# Patient Record
Sex: Female | Born: 1955
Health system: Southern US, Community
[De-identification: ages and names within clinical notes are randomized; demographics above are authoritative.]

## PROBLEM LIST (undated history)

## (undated) DIAGNOSIS — E079 Disorder of thyroid, unspecified: Secondary | ICD-10-CM

## (undated) DIAGNOSIS — K219 Gastro-esophageal reflux disease without esophagitis: Secondary | ICD-10-CM

## (undated) DIAGNOSIS — M1990 Unspecified osteoarthritis, unspecified site: Secondary | ICD-10-CM

## (undated) DIAGNOSIS — Z8709 Personal history of other diseases of the respiratory system: Secondary | ICD-10-CM

## (undated) DIAGNOSIS — R011 Cardiac murmur, unspecified: Secondary | ICD-10-CM

## (undated) DIAGNOSIS — J189 Pneumonia, unspecified organism: Secondary | ICD-10-CM

## (undated) HISTORY — PX: ABDOMINAL HYSTERECTOMY: SHX81

## (undated) HISTORY — PX: BUNIONECTOMY: SHX129

## (undated) HISTORY — PX: HIP SURGERY: SHX245

## (undated) HISTORY — PX: OTHER SURGICAL HISTORY: SHX169

## (undated) HISTORY — DX: Unspecified osteoarthritis, unspecified site: M19.90

## (undated) HISTORY — PX: APPENDECTOMY: SHX54

## (undated) HISTORY — PX: ROTATOR CUFF REPAIR: SHX139

## (undated) HISTORY — DX: Disorder of thyroid, unspecified: E07.9

---

## 2006-11-18 ENCOUNTER — Emergency Department (HOSPITAL_COMMUNITY): Admission: EM | Admit: 2006-11-18 | Discharge: 2006-11-18 | Payer: Self-pay | Admitting: Family Medicine

## 2006-11-21 ENCOUNTER — Emergency Department (HOSPITAL_COMMUNITY): Admission: EM | Admit: 2006-11-21 | Discharge: 2006-11-21 | Payer: Self-pay | Admitting: Family Medicine

## 2007-04-02 ENCOUNTER — Emergency Department (HOSPITAL_COMMUNITY): Admission: EM | Admit: 2007-04-02 | Discharge: 2007-04-02 | Payer: Self-pay | Admitting: Emergency Medicine

## 2007-04-07 ENCOUNTER — Ambulatory Visit (HOSPITAL_COMMUNITY): Admission: RE | Admit: 2007-04-07 | Discharge: 2007-04-07 | Payer: Self-pay | Admitting: Family Medicine

## 2007-04-20 ENCOUNTER — Encounter: Admission: RE | Admit: 2007-04-20 | Discharge: 2007-05-23 | Payer: Self-pay | Admitting: Family Medicine

## 2008-02-09 ENCOUNTER — Ambulatory Visit (HOSPITAL_BASED_OUTPATIENT_CLINIC_OR_DEPARTMENT_OTHER): Admission: RE | Admit: 2008-02-09 | Discharge: 2008-02-09 | Payer: Self-pay | Admitting: Orthopedic Surgery

## 2008-05-10 ENCOUNTER — Ambulatory Visit (HOSPITAL_BASED_OUTPATIENT_CLINIC_OR_DEPARTMENT_OTHER): Admission: RE | Admit: 2008-05-10 | Discharge: 2008-05-11 | Payer: Self-pay | Admitting: Orthopedic Surgery

## 2008-11-23 DIAGNOSIS — J189 Pneumonia, unspecified organism: Secondary | ICD-10-CM

## 2008-11-23 HISTORY — DX: Pneumonia, unspecified organism: J18.9

## 2009-02-14 ENCOUNTER — Ambulatory Visit (HOSPITAL_COMMUNITY): Admission: RE | Admit: 2009-02-14 | Discharge: 2009-02-14 | Payer: Self-pay | Admitting: Family Medicine

## 2009-08-22 ENCOUNTER — Encounter: Admission: RE | Admit: 2009-08-22 | Discharge: 2009-08-22 | Payer: Self-pay | Admitting: Orthopedic Surgery

## 2010-05-04 ENCOUNTER — Emergency Department (HOSPITAL_COMMUNITY): Admission: EM | Admit: 2010-05-04 | Discharge: 2010-05-04 | Payer: Self-pay | Admitting: Emergency Medicine

## 2010-06-22 ENCOUNTER — Emergency Department (HOSPITAL_COMMUNITY): Admission: EM | Admit: 2010-06-22 | Discharge: 2010-06-22 | Payer: Self-pay | Admitting: Family Medicine

## 2010-06-22 ENCOUNTER — Encounter: Payer: Self-pay | Admitting: Internal Medicine

## 2010-06-29 ENCOUNTER — Emergency Department (HOSPITAL_COMMUNITY): Admission: EM | Admit: 2010-06-29 | Discharge: 2010-06-29 | Payer: Self-pay | Admitting: Family Medicine

## 2010-07-18 ENCOUNTER — Ambulatory Visit: Payer: Self-pay | Admitting: Internal Medicine

## 2010-07-18 DIAGNOSIS — R059 Cough, unspecified: Secondary | ICD-10-CM | POA: Insufficient documentation

## 2010-07-18 DIAGNOSIS — R05 Cough: Secondary | ICD-10-CM

## 2010-07-31 ENCOUNTER — Ambulatory Visit: Payer: Self-pay | Admitting: Internal Medicine

## 2010-08-06 ENCOUNTER — Ambulatory Visit: Payer: Self-pay | Admitting: Internal Medicine

## 2010-08-18 ENCOUNTER — Telehealth: Payer: Self-pay | Admitting: Internal Medicine

## 2010-12-23 NOTE — Progress Notes (Signed)
Summary: nos appt  Phone Note Call from Patient   Caller: [email or web address]  Call For: wert Summary of Call: In ref to nos from 9/23, pt states she doesn't need a rov, she feels much better, states she will continue to take meds prescribed. Initial call taken by: Darletta Moll,  August 18, 2010 2:46 PM

## 2010-12-23 NOTE — Assessment & Plan Note (Signed)
Summary: Pulmonary/ ext ov for refractory cough > sinus ct ordered   Copy to:  Dr. Clyda Greener Primary Provider/Referring Provider:  Dr. Clyda Greener  CC:  2 wk followup.  Pt states that after last seen her cough had started to improve but then returned a few days ago when developed a "cold".  Cough is prod with clear sputum.  She states that she had to d/c tramadol due to itching and vomiting.Marland Kitchen  History of Present Illness: 8 yobf quit smokng 1993 no resp symptoms at that point  July 18, 2010  1st pulmonary office eval abupt onset June 09 2010  while in bilbe study  started as a  tickle in throat dx with bronchitis then asthma.  no better with prednisone codiene or codeine assoc with sore throat and dysphagia food  sticks in throat.  cough worse on insp, dry makes her gag and loose voice but  not vomit. day > night but does disturb sleep.  Prilosec Take one 30-60 min before first and last meals of the day  Pepcid 20 mg at bedtime Take delsym two tsp every 12 hours and add tramadol 50 mg up to every 4 hours to suppress the urge to cough.  > could not tolerate tramadol  July 31, 2010 2 wk followup.  Pt states that after last seen her cough had started to improve but then returned a few days ago when developed a "cold".  Cough is prod with clear sputum.  She states that she had to d/c tramadol due to itching and vomiting. worse day than night. mild assoc nasal congestion.  Pt denies any significant sore throat, dysphagia, itching, sneezing,   purulent secretions,  fever, chills, sweats, unintended wt loss, pleuritic or exertional cp, hempoptysis, change in activity tolerance  orthopnea pnd or leg swelling Pt also denies any obvious fluctuation in symptoms with weather or environmental change or other alleviating or aggravating factors.       Current Medications (verified): 1)  Prilosec Otc 20 Mg Tbec (Omeprazole Magnesium) .... Take One 30-60 Min Before First and Last Meals of The Day 2)   Pepcid Ac Maximum Strength 20 Mg Tabs (Famotidine) .... One At Bedtime  Allergies (verified): 1)  ! * Teracycline  Past History:  Past Medical History: Chronic cough............................Marland KitchenWert       - no response to prednisone        - Sinus CT July 31, 2010 >>>  Vital Signs:  Patient profile:   55 year old female Weight:      330 pounds O2 Sat:      96 % on Room air Temp:     98.8 degrees F oral Pulse rate:   89 / minute BP sitting:   112 / 74  (left arm) Cuff size:   large  Vitals Entered By: Vernie Murders (July 31, 2010 11:46 AM)  O2 Flow:  Room air  Physical Exam  Additional Exam:  pleasant but very hoarse amb bf nad   wt 326 July 19, 2010 > 330 July 31, 2010  HEENT: nl dentition, turbinates, and orophanx. Nl external ear canals without cough reflex NECK :  without JVD/Nodes/TM/ nl carotid upstrokes bilaterally LUNGS: no acc muscle use, clear to A and P bilaterally without cough on insp or exp maneuvers CV:  RRR  no s3 or murmur or increase in P2, no edema  ABD:  soft and nontender with nl excursion in the supine position. No bruits or  organomegaly, bowel sounds nl MS:  warm without deformities, calf tenderness, cyanosis or clubbing       Impression & Recommendations:  Problem # 1:  COUGH (ICD-786.2) Of the three most common causes of chronic cough, only one (GERD)  can actually cause the other two and perpetuate the cylce of cough inducing airway trauma, inflammation, heightened sensitivity to reflux which is prompted by the cough itself via a cyclical mechanism.  This may partially respond to steroids and look like asthma and post nasal drainage but never erradicated completely unless the cough and the secondary reflux are eliminated, preferably both at the same time.   The standardized cough guidelines recently published in Chest are a 14 step process, not a single office visit,  and are intended  to address this problem logically,  with an  alogrithm dependent on response to each progressive step  to determine a specific diagnosis with  minimal addtional testing needed. Therefore if compliance is an issue this empiric standardized approach simply won't work.   Start over with suppression with vicodin and sinus ct , next steps allergy eval and reglan challenge if not better  See instructions for specific recommendations   Medications Added to Medication List This Visit: 1)  Prilosec Otc 20 Mg Tbec (Omeprazole magnesium) .... Take one 30-60 min before first and last meals of the day 2)  Prednisone 10 Mg Tabs (Prednisone) .... 4 each am x 2days, 2x2days, 1x2days and stop 3)  Chlor-trimeton 4 Mg Tabs (Chlorpheniramine maleate) .... One every 6 hours for urge to clear the throat as needed 4)  Vicodin Hp 10-660 Mg Tabs (Hydrocodone-acetaminophen) .... One every 4 hours every if needed to suppress the urge to clear throat or cough  Other Orders: Misc. Referral (Misc. Ref) Est. Patient Level IV (47829)  Patient Instructions: 1)  continue prilosec and pepcid as before, perfectly regularly 2)  Take delsym two tsp every 12 hours and add  vicodin  up to every 4 hours to suppress the urge to cough. Swallowing water or using ice chips/non mint and menthol containing candies (such as lifesavers or sugarless jolly ranchers) are also effective.  3)  Chlortrimeton for the tickle every 6 hours 4)  See Patient Care Coordinator before leaving for sinus ct  5)  Please schedule a follow-up appointment in 2 weeks if not 100% better  Prescriptions: VICODIN HP 10-660 MG TABS (HYDROCODONE-ACETAMINOPHEN) one every 4 hours every if needed to suppress the urge to clear throat or cough  #40 x 0   Entered and Authorized by:   Nyoka Cowden MD   Signed by:   Nyoka Cowden MD on 07/31/2010   Method used:   Printed then faxed to ...       [facility] [address] [phone]* (retail)       [address]       [place], [place]  [address]       Ph: [phone]        Fax: [phone]   RxID:   [id number] PREDNISONE 10 MG  TABS (PREDNISONE) 4 each am x 2days, 2x2days, 1x2days and stop  #14 x 0   Entered and Authorized by:   Nyoka Cowden MD   Signed by:   Nyoka Cowden MD on 07/31/2010   Method used:   Electronically to        [facility] [phone]* (retail)       [address]  [place], [place]  [address]       Ph: [phone]       Fax: [phone]   RxID:   [id number]

## 2010-12-23 NOTE — Assessment & Plan Note (Signed)
Summary: Pulmonary new pt eval cough c/w UACS   Visit Type:  Initial Consult Copy to:  Dr. Clyda Greener Primary Provider/Referring Provider:  Dr. Clyda Greener  CC:  Cough.  History of Present Illness: 55 yobf quit smokng 1993 no resp symptoms at that point  July 18, 2010  1st pulmonary office eval abupt onset June 09 2010  while in bilbe study  started as a  tickle in throat dx with bronchitis then asthma.  no better with prednisone codiene or codeine assoc with sore throat and dysphagia food  sticks in throat.  cough worse on insp, dry makes her gag and loose voice but  not vomit. day > night but does disturb sleep. Pt denies any significant itching, sneezing,  nasal congestion or excess secretions,  fever, chills, sweats, unintended wt loss, pleuritic or exertional cp, hempoptysis, change in activity tolerance  orthopnea pnd or leg swelling  Pt also denies any obvious fluctuation in symptoms with weather or environmental change or other alleviating or aggravating factors.     no better with robitussin  Current Medications (verified): 1)  Ventolin Hfa 108 (90 Base) Mcg/act Aers (Albuterol Sulfate) .... 2 Puffs Every 4-6 Hours As Needed  Allergies (verified): 1)  ! * Teracycline  Past History:  Past Medical History: Chronic cough............................Marland KitchenWert       - no response to prednisone   Past Surgical History: Hysterectomy 1986 ACL surgery 2009 Hip surgery 2009  Family History: Heart dz- Mother Pancreatic CA- MGM Negative for respiratory diseases or atopy   Social History: Married with children Mother lives with her Former smoker.  Quit in 1993.  Smoked approx 5 yrs up to 1/4 ppd. Unemployed  Review of Systems       The patient complains of shortness of breath at rest, non-productive cough, chest pain, difficulty swallowing, sore throat, and ear ache.  The patient denies shortness of breath with activity, productive cough, coughing up blood, irregular  heartbeats, acid heartburn, indigestion, loss of appetite, weight change, abdominal pain, tooth/dental problems, headaches, nasal congestion/difficulty breathing through nose, sneezing, itching, anxiety, depression, hand/feet swelling, joint stiffness or pain, rash, change in color of mucus, and fever.    Vital Signs:  Patient profile:   55 year old female Height:      69 inches Weight:      326 pounds BMI:     48.32 O2 Sat:      96 % on Room air Temp:     98.5 degrees F oral Pulse rate:   91 / minute BP sitting:   120 / 70  (left arm) Cuff size:   large  Vitals Entered By: Vernie Murders (July 18, 2010 11:44 AM)  O2 Flow:  Room air  Physical Exam  Additional Exam:  pleasant but very hoarse amb bf nad with classic pseudowheeze resolves with purse lip maneuver  wt 326 July 19, 2010 HEENT: nl dentition, turbinates, and orophanx. Nl external ear canals without cough reflex NECK :  without JVD/Nodes/TM/ nl carotid upstrokes bilaterally LUNGS: no acc muscle use, clear to A and P bilaterally without cough on insp or exp maneuvers CV:  RRR  no s3 or murmur or increase in P2, no edema  ABD:  soft and nontender with nl excursion in the supine position. No bruits or organomegaly, bowel sounds nl MS:  warm without deformities, calf tenderness, cyanosis or clubbing SKIN: warm and dry without lesions   NEURO:  alert, approp, no deficits     CXR  Procedure date:  06/22/2010  Findings:      no acute cardiopulmonary disease  Impression & Recommendations:  Problem # 1:  COUGH (ICD-786.2) Assessment Unchanged  The most common causes of chronic cough in immunocompetent adults include: upper airway cough syndrome (UACS), previously referred to as postnasal drip syndrome,  caused by variety of rhinosinus conditions; (2) asthma; (3) GERD; (4) chronic bronchitis from cigarette smoking or other inhaled environmental irritants; (5) nonasthmatic eosinophilic bronchitis; and (6) bronchiectasis.  These conditions, singly or in combination, have accounted for up to 94% of the causes of chronic cough in prospective studies.  No response to prednisone or saba strongly argues against asthma or eosinophilic bronchitis or rhinitis  this is most c/w  Classic Upper airway cough syndrome, so named because it's frequently impossible to sort out how much is  CR/sinusitis with freq throat clearing (which can be related to primary GERD)   vs  causing  secondary extra esophageal GERD from wide swings in gastric pressure that occur with throat clearing, promoting self use of mint and menthol lozenges that reduce the lower esophageal sphincter tone and exacerbate the problem further These are the same pts who not infrequently have failed to tolerate ace inhibitors,  dry powder inhalers or biphosphonates or report having reflux symptoms that don't respond to standard doses of PPI  For now max rx gerd and suppress cyclical coughing with tramadol  Orders: New Patient Level V (81191)  Medications Added to Medication List This Visit: 1)  Ventolin Hfa 108 (90 Base) Mcg/act Aers (Albuterol sulfate) .... 2 puffs every 4-6 hours as needed 2)  Prilosec Otc 20 Mg Tbec (Omeprazole magnesium) .... Take one 30-60 min before first and last meals of the day 3)  Pepcid Ac Maximum Strength 20 Mg Tabs (Famotidine) .... One at bedtime 4)  Tramadol Hcl 50 Mg Tabs (Tramadol hcl) .... One to two by mouth every 4-6 hours if needed  Patient Instructions: 1)  Prilosec Take one 30-60 min before first and last meals of the day  2)  Pepcid 20 mg at bedtime 3)  Take delsym two tsp every 12 hours and add tramadol 50 mg up to every 4 hours to suppress the urge to cough. Swallowing water or using ice chips/non mint and menthol containing candies (such as lifesavers or sugarless jolly ranchers) are also effective.  4)  GERD (REFLUX)  is a common cause of respiratory symptoms. It commonly presents without heartburn and can be treated  with medication, but also with lifestyle changes including avoidance of late meals, excessive alcohol, smoking cessation, and avoid fatty foods, chocolate, peppermint, colas, red wine, and acidic juices such as orange juice. NO MINT OR MENTHOL PRODUCTS SO NO COUGH DROPS  5)  USE SUGARLESS CANDY INSTEAD (jolley ranchers)  6)  NO OIL BASED VITAMINS  7)  Please schedule a follow-up appointment in 6 weeks, sooner if needed  Prescriptions: TRAMADOL HCL 50 MG  TABS (TRAMADOL HCL) One to two by mouth every 4-6 hours if needed  #40 x 0   Entered and Authorized by:   Nyoka Cowden MD   Signed by:   Nyoka Cowden MD on 07/18/2010   Method used:   Electronically to        [facility] [phone]* (retail)       [address]       [place], [place]  [address]       Ph: [phone]       Fax:  [phone]   RxID:   [id number]

## 2011-04-01 ENCOUNTER — Emergency Department (HOSPITAL_COMMUNITY)
Admission: EM | Admit: 2011-04-01 | Discharge: 2011-04-01 | Disposition: A | Discharge status: Home / Self Care | Payer: Self-pay | Attending: Emergency Medicine | Admitting: Emergency Medicine

## 2011-04-01 ENCOUNTER — Emergency Department (HOSPITAL_COMMUNITY): Payer: Self-pay

## 2011-04-01 DIAGNOSIS — R51 Headache: Secondary | ICD-10-CM | POA: Insufficient documentation

## 2011-04-01 DIAGNOSIS — IMO0002 Reserved for concepts with insufficient information to code with codable children: Secondary | ICD-10-CM | POA: Insufficient documentation

## 2011-04-01 DIAGNOSIS — S0990XA Unspecified injury of head, initial encounter: Secondary | ICD-10-CM | POA: Insufficient documentation
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
## 2011-04-08 ENCOUNTER — Ambulatory Visit
Admission: RE | Admit: 2011-04-08 | Discharge: 2011-04-08 | Disposition: A | Discharge status: Home / Self Care | Payer: No Typology Code available for payment source | Source: Ambulatory Visit | Attending: Family Medicine | Admitting: Family Medicine

## 2011-04-08 ENCOUNTER — Other Ambulatory Visit: Payer: Self-pay | Admitting: Family Medicine

## 2011-04-08 DIAGNOSIS — J209 Acute bronchitis, unspecified: Secondary | ICD-10-CM

## 2011-08-20 LAB — POCT HEMOGLOBIN-HEMACUE
Hemoglobin: 13.1
Operator id: 114531

## 2012-08-14 IMAGING — CT CT CERVICAL SPINE W/O CM
2 of 3 series · 8 of 14 positions shown, 9 images · non-contrast
Comparison: CT 04/02/2007

CT HEAD

CLINICAL DATA: Head injury.  Headache and neck pain

CT HEAD WITHOUT CONTRAST
CT CERVICAL SPINE WITHOUT CONTRAST
TECHNIQUE: Multidetector CT imaging of the head and cervical spine
was performed following the standard protocol without intravenous
contrast.  Multiplanar CT image reconstructions of the cervical
spine were also generated.

[Series 6: c_spine 2.0 b31s detail · axial · 0.30mm/px · z∈[-278,-154]mm · 4 of 104 slices shown, 5 images]
[im 21/104  soft-tissue]
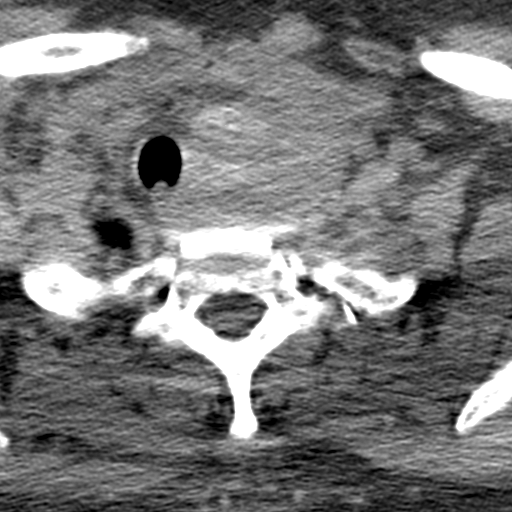
[im 21/104  bone]
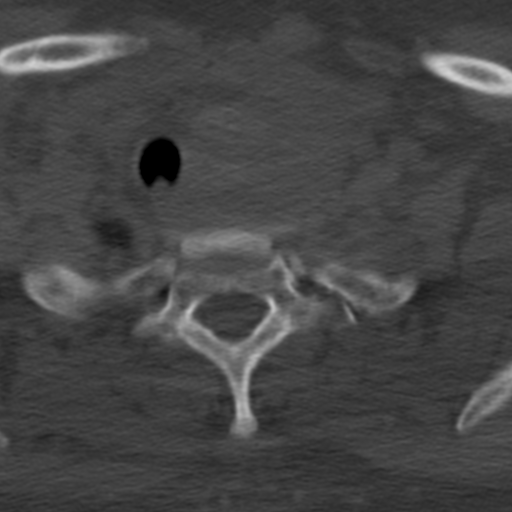
[im 42/104  bone]
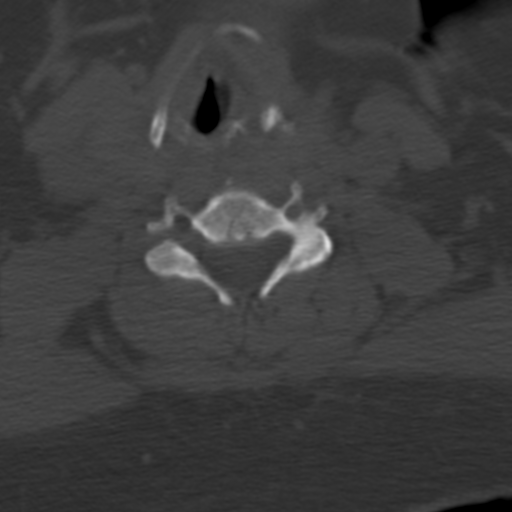
[im 62/104  bone]
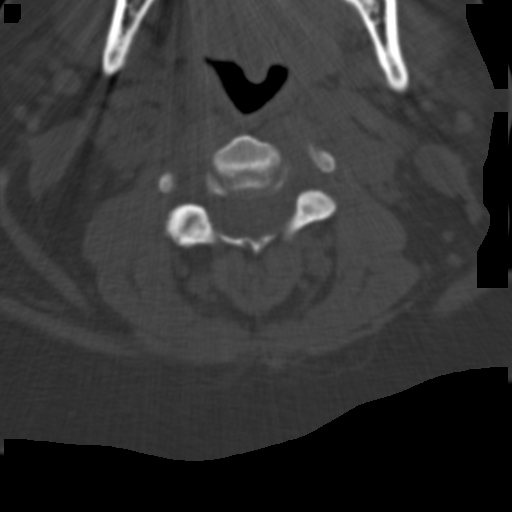
[im 83/104  bone]
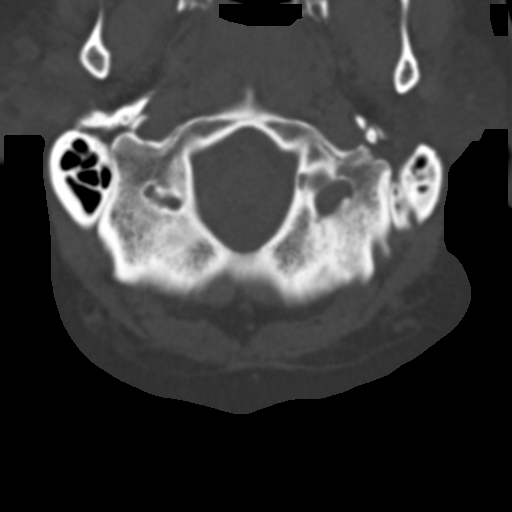

[Series 10: c_spine 2.0 spo thins · axial · 0.26mm/px · z∈[-291,-168]mm · 4 of 105 slices shown]
[im 21/105  bone]
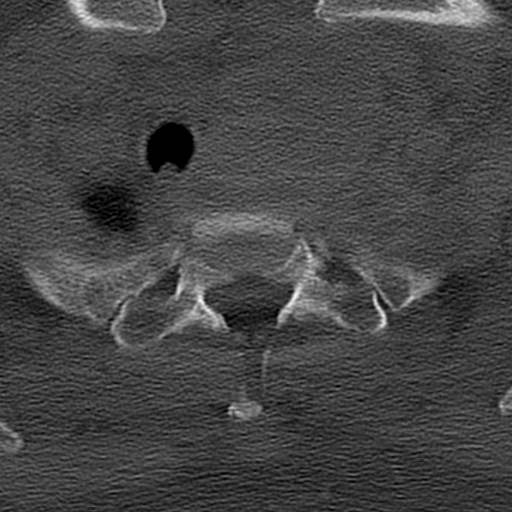
[im 42/105  bone]
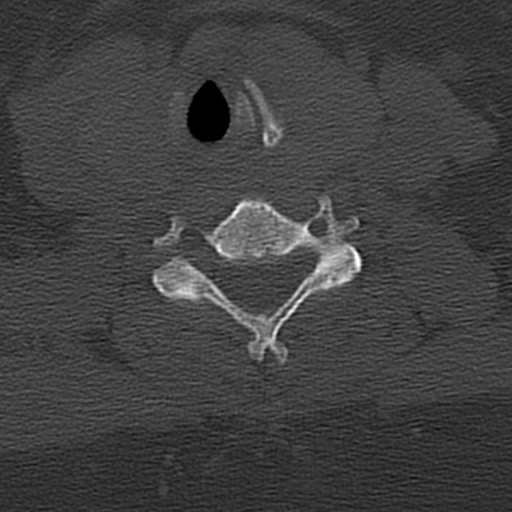
[im 63/105  bone]
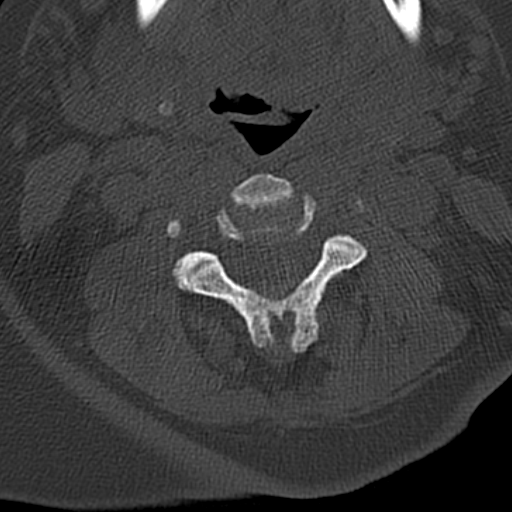
[im 84/105  bone]
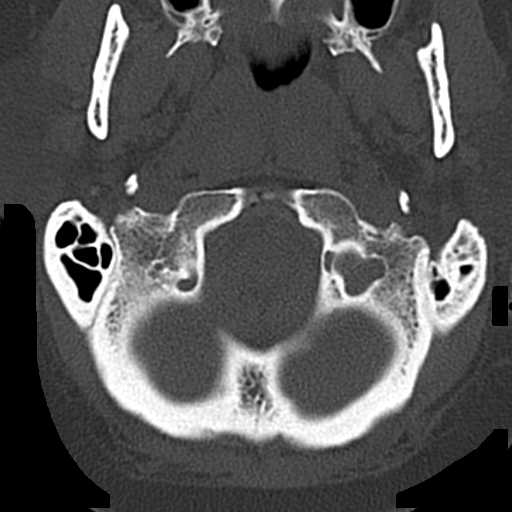

[8 of 14 positions shown; findings below may reference images not displayed]

FINDINGS: Ventricle size is normal.  Negative for hemorrhage.
Negative for infarct or mass.  The brain has a normal appearance.

Mild chronic sinusitis.  Negative for skull fracture.
IMPRESSION: No significant intracranial abnormality.

Mild chronic sinusitis.

CT CERVICAL SPINE
FINDINGS: Normal cervical alignment.  Negative for fracture.  Mild
facet degeneration on the right C2-3 and C3-4.  Mild degeneration
and C1-C2.  No acute bony abnormality.

Soft tissue mass in the upper mediastinum on the left deviates the
trachea to the right.  The mass is compatible with a substernal
goiter and measures 4.3 x 5.0 cm.
IMPRESSION: Negative for fracture.

Substernal goiter on the left.

## 2013-02-24 ENCOUNTER — Encounter (INDEPENDENT_AMBULATORY_CARE_PROVIDER_SITE_OTHER): Payer: Self-pay | Admitting: Surgery

## 2013-02-24 ENCOUNTER — Encounter (INDEPENDENT_AMBULATORY_CARE_PROVIDER_SITE_OTHER): Payer: Self-pay

## 2013-02-24 ENCOUNTER — Ambulatory Visit (INDEPENDENT_AMBULATORY_CARE_PROVIDER_SITE_OTHER): Payer: Self-pay | Admitting: Surgery

## 2013-02-24 VITALS — BP 136/80 | HR 76 | Temp 97.4°F | Resp 16 | Ht 70.0 in | Wt 330.0 lb

## 2013-02-24 DIAGNOSIS — E049 Nontoxic goiter, unspecified: Secondary | ICD-10-CM | POA: Insufficient documentation

## 2013-02-24 NOTE — Patient Instructions (Signed)

## 2013-02-24 NOTE — Progress Notes (Signed)
General Surgery [facility]  Chief Complaint  Patient presents with  . New Evaluation    eval thyroid mass with tracheal deviation - referral from Dr. Bruna Potter    HISTORY: Patient is a 57 year old black female referred by her primary care physician for evaluation of newly diagnosed thyroid nodule. Patient apparently underwent a CT scan in [place] while traveling with her work. This was a CT scan of the chest. It revealed an incidental finding of a left thyroid mass measuring greater than 5 cm in size and causing tracheal deviation to the right. No further workup or evaluation has been performed. Patient is referred to our office for further evaluation and recommendations.  Patient has no prior history of thyroid disease. She has never been on thyroid medication. She has had no prior head or neck surgery. There is no family history of thyroid disease and specifically no history of thyroid cancer. There is no family history of other endocrinopathy. There is no family history of endocrine tumors.  Patient is essentially asymptomatic. She has noted occasional episodes of dysphagia. She denies any pain.  History reviewed. No pertinent past medical history.   No current outpatient prescriptions on file.   No current facility-administered medications for this visit.     Allergies  Allergen Reactions  . Tetracyclines & Related Hives     Family History  Problem Relation Age of Onset  . Diabetes Mother   . Hypertension Mother   . Stroke Father   . Cancer Maternal Grandmother     pancreatic     History   Social History  . Marital Status: Married    Spouse Name: N/A    Number of Children: N/A  . Years of Education: N/A   Social History Main Topics  . Smoking status: Former Smoker    Types: Cigarettes    Quit date: 11/24/1983  . Smokeless tobacco: Never Used  . Alcohol Use: Yes     Comment: glass of wine on new year's eve  . Drug Use: No  . Sexually  Active: None   Other Topics Concern  . None   Social History Narrative  . None     REVIEW OF SYSTEMS - PERTINENT POSITIVES ONLY: Denies tremor. Denies palpitations. Denies compressive symptoms. Denies palpable mass.  EXAM: Filed Vitals:   02/24/13 1329  BP: 136/80  Pulse: 76  Temp: 97.4 F (36.3 C)  Resp: 16    HEENT: normocephalic; pupils equal and reactive; sclerae clear; dentition good; mucous membranes moist NECK:  Soft but enlarged thyroid gland, greater on the left than on the right, with the left lobe extending beneath the head of the left clavicle; symmetric on extension; no palpable anterior or posterior cervical lymphadenopathy; no supraclavicular masses; no tenderness CHEST: clear to auscultation bilaterally without rales, rhonchi, or wheezes CARDIAC: regular rate and rhythm without significant murmur; peripheral pulses are full EXT:  non-tender without edema; no deformity; walking brace on her right ankle NEURO: no gross focal deficits; no sign of tremor   LABORATORY RESULTS: See [facility] (CHL-Epic) for most recent results   RADIOLOGY RESULTS: See [facility] (CHL-Epic) for most recent results   IMPRESSION: Left thyroid mass, substernal  PLAN: I discussed the above findings at length with the patient. We reviewed the report of the CT scan of the chest from [place] together. We are going to initiate a workup of her thyroid mass. This will include a thyroid ultrasound and a TSH level. If there is  a significant mass in the left thyroid lobe, we will request an ultrasound-guided fine-needle aspiration biopsy.  Once these studies are performed, I will contact the patient with the results and we will make a decision on how to proceed at that time. Patient and I did discuss the possibility of surgery.  We also discussed the option of observation with sequential ultrasound scanning and physical examination. We will await the results of the above  studies and then make a decision.  Velora Heckler, MD, FACS General & Endocrine Surgery [facility]   Visit Diagnoses: 1. Substernal thyroid goiter     Primary Care Physician: Burtis Junes, MD

## 2013-02-28 ENCOUNTER — Other Ambulatory Visit (HOSPITAL_COMMUNITY)
Admission: RE | Admit: 2013-02-28 | Discharge: 2013-02-28 | Disposition: A | Discharge status: Home / Self Care | Payer: Self-pay | Source: Ambulatory Visit | Attending: Interventional Radiology | Admitting: Interventional Radiology

## 2013-02-28 ENCOUNTER — Ambulatory Visit
Admission: RE | Admit: 2013-02-28 | Discharge: 2013-02-28 | Disposition: A | Discharge status: Home / Self Care | Payer: No Typology Code available for payment source | Source: Ambulatory Visit | Attending: Surgery | Admitting: Surgery

## 2013-02-28 DIAGNOSIS — E049 Nontoxic goiter, unspecified: Secondary | ICD-10-CM

## 2013-02-28 DIAGNOSIS — E041 Nontoxic single thyroid nodule: Secondary | ICD-10-CM | POA: Insufficient documentation

## 2013-03-03 ENCOUNTER — Telehealth (INDEPENDENT_AMBULATORY_CARE_PROVIDER_SITE_OTHER): Payer: Self-pay

## 2013-03-03 NOTE — Telephone Encounter (Signed)
Request to review path and u/s sent to Dr Gerrit Friends for review. Pt advised of path result and will call her with recommendations once Dr Gerrit Friends has reviewed results.

## 2013-03-09 ENCOUNTER — Telehealth (INDEPENDENT_AMBULATORY_CARE_PROVIDER_SITE_OTHER): Payer: Self-pay

## 2013-03-09 ENCOUNTER — Other Ambulatory Visit (INDEPENDENT_AMBULATORY_CARE_PROVIDER_SITE_OTHER): Payer: Self-pay

## 2013-03-09 DIAGNOSIS — E042 Nontoxic multinodular goiter: Secondary | ICD-10-CM

## 2013-03-09 NOTE — Telephone Encounter (Signed)
Message copied by Joanette Gula on Thu Mar 09, 2013  3:37 PM ------      Message from: Velora Heckler      Created: Thu Mar 09, 2013  3:31 PM       Notify patient that biopsy is benign.  Will see in 6 months with follow up ultrasound and TSH level please.            Thanks,            tmg             ------

## 2013-03-09 NOTE — Telephone Encounter (Signed)
Per Dr Gerrit Friends pt advised of path result and f/u 6 mo U/S with tsh will be needed. U/s date will be set up and mailed to pt with lab slip for Oct 2014.

## 2013-03-20 ENCOUNTER — Encounter (INDEPENDENT_AMBULATORY_CARE_PROVIDER_SITE_OTHER): Payer: Self-pay

## 2013-03-22 ENCOUNTER — Encounter (INDEPENDENT_AMBULATORY_CARE_PROVIDER_SITE_OTHER): Payer: Self-pay

## 2013-08-30 ENCOUNTER — Telehealth (INDEPENDENT_AMBULATORY_CARE_PROVIDER_SITE_OTHER): Payer: Self-pay | Admitting: General Surgery

## 2013-08-30 NOTE — Telephone Encounter (Signed)
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  She will be back and available from 09/13/13 through 09/24/13.

## 2013-08-31 ENCOUNTER — Telehealth (INDEPENDENT_AMBULATORY_CARE_PROVIDER_SITE_OTHER): Payer: Self-pay | Admitting: *Deleted

## 2013-08-31 NOTE — Telephone Encounter (Signed)
Per pts request I called and rescheduled her ultrasound for 09/13/13 @ 1:00p located at [facility].  Pt made aware.

## 2013-09-04 ENCOUNTER — Telehealth (INDEPENDENT_AMBULATORY_CARE_PROVIDER_SITE_OTHER): Payer: Self-pay | Admitting: *Deleted

## 2013-09-04 ENCOUNTER — Other Ambulatory Visit (INDEPENDENT_AMBULATORY_CARE_PROVIDER_SITE_OTHER): Payer: Self-pay | Admitting: Surgery

## 2013-09-04 DIAGNOSIS — E042 Nontoxic multinodular goiter: Secondary | ICD-10-CM

## 2013-09-04 NOTE — Telephone Encounter (Signed)
LM for pt to return my call, I was calling to remind pt of her appt for her ultrasound at [facility] on 09/08/13 @ 10:45am.  Also I am mailing her lab slips today for her to get her lab work drawn on this same day at [facility] located at [facility]. [address].

## 2013-09-08 ENCOUNTER — Other Ambulatory Visit: Payer: Self-pay

## 2013-09-13 ENCOUNTER — Ambulatory Visit
Admission: RE | Admit: 2013-09-13 | Discharge: 2013-09-13 | Disposition: A | Discharge status: Home / Self Care | Payer: No Typology Code available for payment source | Source: Ambulatory Visit | Attending: Surgery | Admitting: Surgery

## 2013-09-13 DIAGNOSIS — E042 Nontoxic multinodular goiter: Secondary | ICD-10-CM

## 2013-09-21 ENCOUNTER — Telehealth (INDEPENDENT_AMBULATORY_CARE_PROVIDER_SITE_OTHER): Payer: Self-pay

## 2013-09-21 NOTE — Telephone Encounter (Signed)
LMOM for pt to call and make LTF appt. When pt calls we can advise her Dr Gerrit Friends will go over test results at [facility].

## 2013-10-25 ENCOUNTER — Encounter (INDEPENDENT_AMBULATORY_CARE_PROVIDER_SITE_OTHER): Payer: Self-pay | Admitting: Surgery

## 2013-11-27 ENCOUNTER — Encounter (INDEPENDENT_AMBULATORY_CARE_PROVIDER_SITE_OTHER): Payer: Self-pay

## 2013-11-27 ENCOUNTER — Encounter (INDEPENDENT_AMBULATORY_CARE_PROVIDER_SITE_OTHER): Payer: Self-pay | Admitting: Surgery

## 2014-02-13 ENCOUNTER — Ambulatory Visit (INDEPENDENT_AMBULATORY_CARE_PROVIDER_SITE_OTHER): Payer: Self-pay | Admitting: Surgery

## 2014-02-14 ENCOUNTER — Other Ambulatory Visit (INDEPENDENT_AMBULATORY_CARE_PROVIDER_SITE_OTHER): Payer: Self-pay

## 2014-02-14 DIAGNOSIS — E049 Nontoxic goiter, unspecified: Secondary | ICD-10-CM

## 2014-02-21 ENCOUNTER — Ambulatory Visit (INDEPENDENT_AMBULATORY_CARE_PROVIDER_SITE_OTHER): Payer: Self-pay | Admitting: Surgery

## 2014-03-29 ENCOUNTER — Ambulatory Visit (INDEPENDENT_AMBULATORY_CARE_PROVIDER_SITE_OTHER): Payer: Self-pay | Admitting: Surgery

## 2014-04-18 ENCOUNTER — Ambulatory Visit (INDEPENDENT_AMBULATORY_CARE_PROVIDER_SITE_OTHER): Payer: BC Managed Care – PPO | Admitting: Surgery

## 2014-04-18 ENCOUNTER — Encounter (INDEPENDENT_AMBULATORY_CARE_PROVIDER_SITE_OTHER): Payer: Self-pay | Admitting: Surgery

## 2014-04-18 VITALS — BP 118/65 | HR 69 | Temp 98.4°F | Resp 16 | Ht 70.0 in | Wt 308.8 lb

## 2014-04-18 DIAGNOSIS — E049 Nontoxic goiter, unspecified: Secondary | ICD-10-CM

## 2014-04-18 LAB — TSH: TSH: 1.405 u[IU]/mL (ref 0.350–4.500)

## 2014-04-18 NOTE — Progress Notes (Signed)
General Surgery [facility]  Chief Complaint  Patient presents with  . Follow-up    left thyroid mass    HISTORY: Patient is a 58 year old female evaluated 1 year ago for newly diagnosed left thyroid mass. This was found as an incidental finding on an x-ray performed in [place]. She was referred to my practice and underwent a thyroid ultrasound and laboratory studies. She was found to have a dominant 7 cm mass in the lower left thyroid lobe. Fine needle aspiration biopsy was performed in April 2014. This was consistent with non-neoplastic goiter.  At my request the patient returns for evaluation. A repeat ultrasound was performed in October 2014 showed slight interval enlargement of the dominant left thyroid mass. No nodules were noted on the right side.  PERTINENT REVIEW OF SYSTEMS: Chronic cough. Mild globus sensation. Occasional dysphagia.  EXAM: HEENT: normocephalic; pupils equal and reactive; sclerae clear; dentition good; mucous membranes moist NECK:  Dominant soft mass involving the lower portion of the left thyroid lobe and extending to the clavicle; right thyroid lobe without palpable abnormalities; asymmetric on extension; no palpable anterior or posterior cervical lymphadenopathy; no supraclavicular masses; no tenderness CHEST: clear to auscultation bilaterally without rales, rhonchi, or wheezes CARDIAC: regular rate and rhythm without significant murmur; peripheral pulses are full EXT:  non-tender without edema; no deformity NEURO: no gross focal deficits; no sign of tremor   IMPRESSION: Left thyroid mass, 7 cm, with mild compressive symptoms  PLAN: The patient and I discussed the above findings. We reviewed her ultrasound results from October 2014. I have offered her a choice between continued observation with repeat ultrasound, laboratory studies, and physical examination in one year versus proceeding with left thyroid lobectomy.  We discussed  the procedure of left thyroid lobectomy at length. We discussed potential complications including recurrent laryngeal nerve injury. We discussed the hospital stay to be anticipated and the recovery and return to work. We discussed the potential need for lifelong thyroid hormone replacement. She understands and wishes to proceed with surgery for left thyroid lobectomy in the near future.  The risks and benefits of the procedure have been discussed at length with the patient.  The patient understands the proposed procedure, potential alternative treatments, and the course of recovery to be expected.  All of the patient's questions have been answered at this time.  The patient wishes to proceed with surgery.  Velora Heckler, MD, [facility] Office: [phone]  Visit Diagnoses: 1. Substernal thyroid goiter

## 2014-04-18 NOTE — Patient Instructions (Signed)

## 2014-04-19 ENCOUNTER — Telehealth (INDEPENDENT_AMBULATORY_CARE_PROVIDER_SITE_OTHER): Payer: Self-pay

## 2014-04-19 NOTE — Telephone Encounter (Signed)
TSH 1.405 drawn 04-08-14. Will sent to Dr Gerrit Friends for review.

## 2014-04-20 ENCOUNTER — Telehealth (INDEPENDENT_AMBULATORY_CARE_PROVIDER_SITE_OTHER): Payer: Self-pay | Admitting: Surgery

## 2014-04-20 NOTE — Telephone Encounter (Signed)
Patient met with surgery scheduling, went over financial responsibilities, will call back to schedule surgery.

## 2015-07-02 ENCOUNTER — Encounter (HOSPITAL_COMMUNITY): Payer: Self-pay

## 2015-07-02 ENCOUNTER — Emergency Department (HOSPITAL_COMMUNITY)
Admission: EM | Admit: 2015-07-02 | Discharge: 2015-07-02 | Disposition: A | Discharge status: Home / Self Care | Payer: Worker's Compensation | Attending: Emergency Medicine | Admitting: Emergency Medicine

## 2015-07-02 DIAGNOSIS — H532 Diplopia: Secondary | ICD-10-CM | POA: Diagnosis not present

## 2015-07-02 DIAGNOSIS — R42 Dizziness and giddiness: Secondary | ICD-10-CM | POA: Diagnosis not present

## 2015-07-02 DIAGNOSIS — Z8639 Personal history of other endocrine, nutritional and metabolic disease: Secondary | ICD-10-CM | POA: Insufficient documentation

## 2015-07-02 DIAGNOSIS — W01198D Fall on same level from slipping, tripping and stumbling with subsequent striking against other object, subsequent encounter: Secondary | ICD-10-CM | POA: Insufficient documentation

## 2015-07-02 DIAGNOSIS — J3489 Other specified disorders of nose and nasal sinuses: Secondary | ICD-10-CM | POA: Insufficient documentation

## 2015-07-02 DIAGNOSIS — S060X0D Concussion without loss of consciousness, subsequent encounter: Secondary | ICD-10-CM | POA: Insufficient documentation

## 2015-07-02 DIAGNOSIS — R51 Headache: Secondary | ICD-10-CM | POA: Diagnosis present

## 2015-07-02 DIAGNOSIS — Z87891 Personal history of nicotine dependence: Secondary | ICD-10-CM | POA: Insufficient documentation

## 2015-07-02 DIAGNOSIS — Z8739 Personal history of other diseases of the musculoskeletal system and connective tissue: Secondary | ICD-10-CM | POA: Insufficient documentation

## 2015-07-02 MED ORDER — PERMETHRIN 5 % EX CREA: TOPICAL_CREAM | CUTANEOUS | Status: DC

## 2015-07-02 MED ORDER — METOCLOPRAMIDE HCL 10 MG PO TABS
10.0000 mg | ORAL_TABLET | Freq: Once | ORAL | Status: AC
  Administered 2015-07-02: 10 mg via ORAL
  Filled 2015-07-02: qty 1

## 2015-07-02 MED ORDER — IBUPROFEN 800 MG PO TABS
800.0000 mg | ORAL_TABLET | Freq: Once | ORAL | Status: AC
  Administered 2015-07-02: 800 mg via ORAL
  Filled 2015-07-02: qty 1

## 2015-07-02 MED ORDER — DIPHENHYDRAMINE HCL 25 MG PO CAPS
50.0000 mg | ORAL_CAPSULE | Freq: Once | ORAL | Status: AC
  Administered 2015-07-02: 50 mg via ORAL
  Filled 2015-07-02: qty 2

## 2015-07-02 NOTE — ED Provider Notes (Addendum)
CSN: [id number]     Arrival date & time 07/02/15  1702 History  This chart was scribed for Isabel Crumble, MD by Placido Sou, ED scribe. This patient was seen in room AH01C/AH01C and the patient's care was started at 11:10 PM.    Chief Complaint  Patient presents with  . Headache   The history is provided by the patient. No language interpreter was used.    HPI Comments: Isabel Chen is a 59 y.o. female who presents to the Emergency Department complaining of a fall that occurred last week. Pt notes tripping over a curb, falling forwards and striking the ground with the anterior of her head. Pt notes that she previously went to another ED and was diagnosed with a concussion. CT scan was not performed.  She notes she is still experiencing an associated HA, dizziness, intermittent double vision and a feeling of rhinorrhea with no visible drainage. She was given percocet and only took 3 due to experiencing hallucinations when taking them. Pt denies taking any blood thinning medications. Pt denies weakness in her arms and legs.   Past Medical History  Diagnosis Date  . Arthritis   . Thyroid disease    Past Surgical History  Procedure Laterality Date  . Abdominal hysterectomy    . Rotator cuff repair    . Bunionectomy     Family History  Problem Relation Age of Onset  . Diabetes Mother   . Hypertension Mother   . Stroke Father   . Cancer Maternal Grandmother     pancreatic   History  Substance Use Topics  . Smoking status: Former Smoker    Types: Cigarettes    Quit date: 11/24/1983  . Smokeless tobacco: Never Used  . Alcohol Use: Yes     Comment: glass of wine on new year's eve   OB History    No data available     Review of Systems A complete 10 system review of systems was obtained and all systems are negative except as noted in the HPI and PMH.   Allergies  Tetracyclines & related  Home Medications   Prior to Admission medications   Not on File   BP 143/83 mmHg   Pulse 65  Temp(Src) 97.7 F (36.5 C) (Oral)  Resp 16  Ht 5\' 7"  (1.702 m)  Wt 340 lb 3.2 oz (154.314 kg)  BMI 53.27 kg/m2  SpO2 99% Physical Exam  Constitutional: She is oriented to person, place, and time. She appears well-developed and well-nourished. No distress.  HENT:  Head: Normocephalic and atraumatic.  Nose: Nose normal.  Mouth/Throat: Oropharynx is clear and moist. No oropharyngeal exudate.  Eyes: Conjunctivae and EOM are normal. Pupils are equal, round, and reactive to light. No scleral icterus.  Neck: Normal range of motion. Neck supple. No JVD present. No tracheal deviation present. No thyromegaly present.  Cardiovascular: Normal rate, regular rhythm and normal heart sounds.  Exam reveals no gallop and no friction rub.   No murmur heard. Pulmonary/Chest: Effort normal and breath sounds normal. No respiratory distress. She has no wheezes. She exhibits no tenderness.  Abdominal: Soft. Bowel sounds are normal. She exhibits no distension and no mass. There is no tenderness. There is no rebound and no guarding.  Musculoskeletal: Normal range of motion. She exhibits no edema or tenderness.  Lymphadenopathy:    She has no cervical adenopathy.  Neurological: She is alert and oriented to person, place, and time. No cranial nerve deficit. She exhibits normal muscle tone.  nml strength and sensation in all extremities, normal cerebellar testing, normal gait  Skin: Skin is warm and dry. No rash noted. No erythema. No pallor.  Nursing note and vitals reviewed.   ED Course  Procedures  DIAGNOSTIC STUDIES: Oxygen Saturation is 99% on RA, normal by my interpretation.    COORDINATION OF CARE: 11:16 PM Discussed treatment plan with pt at bedside and pt agreed to plan.  Labs Review Labs Reviewed - No data to display  Imaging Review No results found.   EKG Interpretation None      MDM   Final diagnoses:  None    Patient presents to the ED for persistent headache, blurry  vision, and dizziness after falling and hitting her head 1 week ago.  She is experiencing symptoms of a concussion.  She was again offered CT scan, but is refusing it.  I do not think it will be beneficial as the injury was 1 week ago and she has a completely normal neuro exam.  She was given motrin, reglan, and benadryl for treatment.  Concussion education, including full cognitive rest, was given.  PCP fu advised within 3 days.  She appears well and in NAD.  Her VS remain within her normal limits and she is safe for DC.    I personally performed the services described in this documentation, which was scribed in my presence. The recorded information has been reviewed and is accurate.   Isabel Crumble, MD 07/02/15 [id number]

## 2015-07-02 NOTE — ED Notes (Signed)
Pt left with all belongings and ambulated out of treatment area with family.  

## 2015-07-02 NOTE — ED Notes (Signed)
Onset 06-27-15 pt was in [place], walking into truck stop tripped over uneven curb and fell against door hitting front of head.  Pt seen in ED, dx with concussion.  Hematoma resolved, pt still having headache and describes she "feels something draining in her head as if she was going to have runny nose but does not" and blurred vision.

## 2015-07-02 NOTE — Discharge Instructions (Signed)
Concussion Ms. Kimbell, your symptoms are consistent with a concussion.  You need full cognitive rest and to see your primary care doctor within 3 days for close follow up.  Do not take percocet, as this can make your headache worse.  Take ibuprofen  every 8 hours as needed for headache.  If symptoms worsen, come back to the ED immediately. Thank you. A concussion is a brain injury. It is caused by:  A hit to the head.  A quick and sudden movement (jolt) of the head or neck. A concussion is usually not life threatening. Even so, it can cause serious problems. If you had a concussion before, you may have concussion-like problems after a hit to your head. HOME CARE General Instructions  Follow your doctor's directions carefully.  Take medicines only as told by your doctor.  Only take medicines your doctor says are safe.  Do not drink alcohol until your doctor says it is okay. Alcohol and some drugs can slow down healing. They can also put you at risk for further injury.  If you are having trouble remembering things, write them down.  Try to do one thing at a time if you get distracted easily. For example, do not watch TV while making dinner.  Talk to your family members or close friends when making important decisions.  Follow up with your doctor as told.  Watch your symptoms. Tell others to do the same. Serious problems can sometimes happen after a concussion. Older adults are more likely to have these problems.  Tell your teachers, school nurse, school counselor, coach, Event organiser, or work Production designer, theatre/television/film about your concussion. Tell them about what you can or cannot do. They should watch to see if:  It gets even harder for you to pay attention or concentrate.  It gets even harder for you to remember things or learn new things.  You need more time than normal to finish things.  You become annoyed (irritable) more than before.  You are not able to deal with stress as  well.  You have more problems than before.  Rest. Make sure you:  Get plenty of sleep at night.  Go to sleep early.  Go to bed at the same time every day. Try to wake up at the same time.  Rest during the day.  Take naps when you feel tired.  Limit activities where you have to think a lot or concentrate. These include:  Doing homework.  Doing work related to a job.  Watching TV.  Using the computer. Returning To Your Regular Activities Return to your normal activities slowly, not all at once. You must give your body and brain enough time to heal.   Do not play sports or do other athletic activities until your doctor says it is okay.  Ask your doctor when you can drive, ride a bicycle, or work other vehicles or machines. Never do these things if you feel dizzy.  Ask your doctor about when you can return to work or school. Preventing Another Concussion It is very important to avoid another brain injury, especially before you have healed. In rare cases, another injury can lead to permanent brain damage, brain swelling, or death. The risk of this is greatest during the first 7-10 days after your injury. Avoid injuries by:   Wearing a seat belt when riding in a car.  Not drinking too much alcohol.  Avoiding activities that could lead to a second concussion (such as contact sports).  Wearing a helmet when doing activities like:  Biking.  Skiing.  Skateboarding.  Skating.  Making your home safer by:  Removing things from the floor or stairways that could make you trip.  Using grab bars in bathrooms and handrails by stairs.  Placing non-slip mats on floors and in bathtubs.  Improve lighting in dark areas. GET HELP IF:  It gets even harder for you to pay attention or concentrate.  It gets even harder for you to remember things or learn new things.  You need more time than normal to finish things.  You become annoyed (irritable) more than before.  You are  not able to deal with stress as well.  You have more problems than before.  You have problems keeping your balance.  You are not able to react quickly when you should. Get help if you have any of these problems for more than 2 weeks:   Lasting (chronic) headaches.  Dizziness or trouble balancing.  Feeling sick to your stomach (nausea).  Seeing (vision) problems.  Being affected by noises or light more than normal.  Feeling sad, low, down in the dumps, blue, gloomy, or empty (depressed).  Mood changes (mood swings).  Feeling of fear or nervousness about what may happen (anxiety).  Feeling annoyed.  Memory problems.  Problems concentrating or paying attention.  Sleep problems.  Feeling tired all the time. GET HELP RIGHT AWAY IF:   You have bad headaches or your headaches get worse.  You have weakness (even if it is in one hand, leg, or part of the face).  You have loss of feeling (numbness).  You feel off balance.  You keep throwing up (vomiting).  You feel tired.  One black center of your eye (pupil) is larger than the other.  You twitch or shake violently (convulse).  Your speech is not clear (slurred).  You are more confused, easily angered (agitated), or annoyed than before.  You have more trouble resting than before.  You are unable to recognize people or places.  You have neck pain.  It is difficult to wake you up.  You have unusual behavior changes.  You pass out (lose consciousness). MAKE SURE YOU:   Understand these instructions.  Will watch your condition.  Will get help right away if you are not doing well or get worse. Document Released: 10/28/2009 Document Revised: 03/26/2014 Document Reviewed: 06/01/2013 [facility] Patient Information 2015 [place], [place]. This information is not intended to replace advice given to you by your health care provider. Make sure you discuss any questions you have with your health care provider.

## 2015-07-16 ENCOUNTER — Ambulatory Visit: Payer: Self-pay | Admitting: Surgery

## 2015-07-18 ENCOUNTER — Other Ambulatory Visit: Payer: Self-pay | Admitting: Family Medicine

## 2015-07-18 DIAGNOSIS — R51 Headache: Principal | ICD-10-CM

## 2015-07-18 DIAGNOSIS — R519 Headache, unspecified: Secondary | ICD-10-CM

## 2015-07-22 ENCOUNTER — Other Ambulatory Visit: Payer: Self-pay | Admitting: Family Medicine

## 2015-07-22 ENCOUNTER — Inpatient Hospital Stay: Admission: RE | Admit: 2015-07-22 | Payer: Self-pay | Source: Ambulatory Visit

## 2015-07-22 ENCOUNTER — Ambulatory Visit
Admission: RE | Admit: 2015-07-22 | Discharge: 2015-07-22 | Disposition: A | Discharge status: Home / Self Care | Payer: No Typology Code available for payment source | Source: Ambulatory Visit | Attending: Family Medicine | Admitting: Family Medicine

## 2015-07-22 DIAGNOSIS — M25511 Pain in right shoulder: Secondary | ICD-10-CM

## 2015-07-22 DIAGNOSIS — M25512 Pain in left shoulder: Secondary | ICD-10-CM

## 2015-07-22 DIAGNOSIS — M542 Cervicalgia: Secondary | ICD-10-CM

## 2015-07-25 ENCOUNTER — Ambulatory Visit
Admission: RE | Admit: 2015-07-25 | Discharge: 2015-07-25 | Disposition: A | Discharge status: Home / Self Care | Payer: No Typology Code available for payment source | Source: Ambulatory Visit | Attending: Family Medicine | Admitting: Family Medicine

## 2015-08-18 ENCOUNTER — Emergency Department: Payer: No Typology Code available for payment source

## 2015-08-18 ENCOUNTER — Emergency Department
Admission: EM | Admit: 2015-08-18 | Discharge: 2015-08-18 | Disposition: A | Discharge status: Home / Self Care | Payer: No Typology Code available for payment source | Attending: Emergency Medicine | Admitting: Emergency Medicine

## 2015-08-18 ENCOUNTER — Encounter: Payer: Self-pay | Admitting: Emergency Medicine

## 2015-08-18 DIAGNOSIS — R079 Chest pain, unspecified: Secondary | ICD-10-CM | POA: Insufficient documentation

## 2015-08-18 DIAGNOSIS — Z87891 Personal history of nicotine dependence: Secondary | ICD-10-CM | POA: Diagnosis not present

## 2015-08-18 DIAGNOSIS — R062 Wheezing: Secondary | ICD-10-CM | POA: Insufficient documentation

## 2015-08-18 DIAGNOSIS — R0602 Shortness of breath: Secondary | ICD-10-CM | POA: Insufficient documentation

## 2015-08-18 DIAGNOSIS — E041 Nontoxic single thyroid nodule: Secondary | ICD-10-CM | POA: Insufficient documentation

## 2015-08-18 LAB — CBC WITH DIFFERENTIAL/PLATELET
BASOS ABS: 0.1 10*3/uL (ref 0–0.1)
Basophils Relative: 1 %
EOS ABS: 0.3 10*3/uL (ref 0–0.7)
Eosinophils Relative: 3 %
HCT: 36.5 % (ref 35.0–47.0)
Hemoglobin: 12.3 g/dL (ref 12.0–16.0)
Lymphocytes Relative: 29 %
Lymphs Abs: 2.4 10*3/uL (ref 1.0–3.6)
MCH: 27.8 pg (ref 26.0–34.0)
MCHC: 33.7 g/dL (ref 32.0–36.0)
MCV: 82.4 fL (ref 80.0–100.0)
MONO ABS: 0.4 10*3/uL (ref 0.2–0.9)
MONOS PCT: 5 %
Neutro Abs: 5.2 10*3/uL (ref 1.4–6.5)
Neutrophils Relative %: 62 %
Platelets: 295 10*3/uL (ref 150–440)
RBC: 4.43 MIL/uL (ref 3.80–5.20)
RDW: 16.3 % — AB (ref 11.5–14.5)
WBC: 8.3 10*3/uL (ref 3.6–11.0)

## 2015-08-18 LAB — BASIC METABOLIC PANEL
Anion gap: 8 (ref 5–15)
BUN: 12 mg/dL (ref 6–20)
CO2: 27 mmol/L (ref 22–32)
CREATININE: 0.84 mg/dL (ref 0.44–1.00)
Calcium: 9.1 mg/dL (ref 8.9–10.3)
Chloride: 108 mmol/L (ref 101–111)
Glucose, Bld: 109 mg/dL — ABNORMAL HIGH (ref 65–99)
Potassium: 3.6 mmol/L (ref 3.5–5.1)
SODIUM: 143 mmol/L (ref 135–145)

## 2015-08-18 LAB — TROPONIN I

## 2015-08-18 LAB — FIBRIN DERIVATIVES D-DIMER (ARMC ONLY): Fibrin derivatives D-dimer (ARMC): 1438 — ABNORMAL HIGH (ref 0–499)

## 2015-08-18 MED ORDER — IOHEXOL 350 MG/ML SOLN
100.0000 mL | Freq: Once | INTRAVENOUS | Status: AC | PRN
  Administered 2015-08-18: 88 mL via INTRAVENOUS

## 2015-08-18 MED ORDER — ASPIRIN 81 MG PO CHEW
81.0000 mg | CHEWABLE_TABLET | Freq: Once | ORAL | Status: AC
  Administered 2015-08-18: 81 mg via ORAL
  Filled 2015-08-18: qty 1

## 2015-08-18 MED ORDER — TRAMADOL HCL 50 MG PO TABS: 50.0000 mg | ORAL_TABLET | Freq: Four times a day (QID) | ORAL | Status: DC | PRN

## 2015-08-18 NOTE — ED Notes (Signed)
States she developed some mid chest pain which radiated under armpits and into tongue

## 2015-08-18 NOTE — ED Provider Notes (Signed)
Time Seen: Approximately 1325  I have reviewed the triage notes  Chief Complaint: Chest Pain   History of Present Illness: Isabel Chen is a 59 y.o. female who presents with an episode of substernal chest discomfort that started approximately 3 minutes prior to arrival. She states the pain was substernal and radiated into both axillary areas and her throat and into her tongue. Patient states she was diaphoretic without any nausea or vomiting. She states she had some mild shortness of breath otherwise not unusual for her as she has some form of thyroid mass that is under investigation at this point. Patient states her pain essentially resolved at this point. Describes it as a sharp pain also goes on to state that she's had similar episodes of discomfort without radiation to the neck region over the last "" few days "". She states he's episodes of been transient lasting less than a minute. Niacin any pleuritic or positional component to her pain she denies any back or flank discomfort. She denies any calf tenderness or swelling. She states she's been relatively inactive since she's been out of work recovering from a head injury. She denies any previous history of blood clots in her legs or lungs.   Past Medical History  Diagnosis Date  . Arthritis   . Thyroid disease     Patient Active Problem List   Diagnosis Date Noted  . 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 COUGH 07/18/2010    Past Surgical History  Procedure Laterality Date  . Abdominal hysterectomy    . Rotator cuff repair    . Bunionectomy      Past Surgical History  Procedure Laterality Date  . Abdominal hysterectomy    . Rotator cuff repair    . Bunionectomy      Current Outpatient Rx  Name  Route  Sig  Dispense  Refill  . permethrin (ELIMITE) 5 % cream      Apply to entire body, then wash off after 8-14 hours. THEN, repeat again in 1 week. Wash all clothing and bedding in hot water, treat close contacts as  well.   60 g   0     Allergies:  Tetracyclines & related  Family History: Family History  Problem Relation Age of Onset  . Diabetes Mother   . Hypertension Mother   . Stroke Father   . Cancer Maternal Grandmother     pancreatic   She states her mother had a stent placed late in life. She states that she has 2 sisters that do not have any cardiovascular disease. Social History: Social History  Substance Use Topics  . Smoking status: Former Smoker    Types: Cigarettes    Quit date: 11/24/1983  . Smokeless tobacco: Never Used  . Alcohol Use: Yes     Comment: glass of wine on new year's eve     Review of Systems:   10 point review of systems was performed and was otherwise negative:  Constitutional: No fever Eyes: No visual disturbances ENT: No sore throat, ear pain Cardiac: No chest pain Respiratory: Mild shortness of breath, wheezing, or stridor Abdomen: No abdominal pain, no vomiting, No diarrhea Endocrine: No weight loss, No night sweats Extremities: No peripheral edema, cyanosis Skin: No rashes, easy bruising Neurologic: No focal weakness, trouble with speech or swollowing Urologic: No dysuria, Hematuria, or urinary frequency No difficulty with speech or swallowing  Physical Exam:  ED Triage Vitals  Enc Vitals Group     BP --  Pulse Rate 08/18/15 1317 83     Resp 08/18/15 1317 20     Temp 08/18/15 1317 98 F (36.7 C)     Temp Source 08/18/15 1317 Oral     SpO2 08/18/15 1317 98 %     Weight 08/18/15 1315 345 lb (156.491 kg)     Height 08/18/15 1315  (1.753 m)     Head Cir --      Peak Flow --      Pain Score 08/18/15 1315 5     Pain Loc --      Pain Edu? --      Excl. in GC? --     General: Awake , Alert , and Oriented times 3; GCS 15 Head: Normal cephalic , atraumatic Eyes: Pupils equal , round, reactive to light Nose/Throat: No nasal drainage, patent upper airway without erythema or exudate.  Neck: Supple, Full range of motion, No  anterior adenopathy or palpable thyroid masses Lungs: Clear to ascultation without wheezes , rhonchi, or rales Heart: Regular rate, regular rhythm without murmurs , gallops , or rubs Abdomen: Soft, non tender without rebound, guarding , or rigidity; bowel sounds positive and symmetric in all 4 quadrants. No organomegaly .        Extremities: 2 plus symmetric pulses. No edema, clubbing or cyanosis Neurologic: normal ambulation, Motor symmetric without deficits, sensory intact Skin: warm, dry, no rashes   Labs:   All laboratory work was reviewed including any pertinent negatives or positives listed below:  Labs Reviewed  BASIC METABOLIC PANEL  CBC WITH DIFFERENTIAL/PLATELET  FIBRIN DERIVATIVES D-DIMER ([facility] ONLY)  TROPONIN I    EKG: * ED ECG REPORT I, Jennye Moccasin, the attending physician, personally viewed and interpreted this ECG.  Date: 08/18/2015 EKG Time: 1314 Rate: 97 Rhythm: normal sinus rhythm with occasional PVC QRS Axis: normal Intervals: normal ST/T Wave abnormalities: normal Conduction Disutrbances: none Narrative Interpretation:  Poor R-wave progression noticed in the anterior leads without any acute ischemic changes.    Radiology:     EXAM: CT ANGIOGRAPHY CHEST WITH CONTRAST  TECHNIQUE: Multidetector CT imaging of the chest was performed using the standard protocol during bolus administration of intravenous contrast. Multiplanar CT image reconstructions and MIPs were obtained to evaluate the vascular anatomy.  CONTRAST: 88mL OMNIPAQUE IOHEXOL 350 MG/ML SOLN  COMPARISON: Chest radiographs today and 04/08/2011. Thyroid ultrasound 09/13/2013.  FINDINGS: Mediastinum: The pulmonary arteries are well opacified with contrast. There is no evidence of acute pulmonary embolism. No significant arterial abnormalities identified. The heart size is normal. There is no pericardial effusion. There are no enlarged mediastinal, hilar or axillary lymph nodes.  There is asymmetric enlargement of the left thyroid lobe with resulting tracheal deviation to the right. The left lobe measures up to 6.7 x 4.8 cm transverse, and more than 5.5 cm in height (incompletely visualized). This thyroid enlargement appears progressive compared with prior thyroid ultrasound of 2 years ago.  Lungs/Pleura: There is no pleural effusion.Minimal dependent atelectasis at both lung bases. The lungs are otherwise clear.  Upper abdomen: Unremarkable. There is no adrenal mass.  Musculoskeletal/Chest wall: No chest wall lesion or acute osseous findings.  Review of the MIP images confirms the above findings.  IMPRESSION: 1. No evidence of acute pulmonary embolism or other acute chest process. 2. Progressive asymmetric enlargement of the left thyroid lobe with resulting tracheal deviation the right. This has been previously biopsied, although could contribute to shortness of breath. Thyroidal ultrasound follow-up recommended.  I personally reviewed the radiologic studies    ED Course:  Differential includes all life-threatening causes for chest pain. This includes but is not exclusive to acute coronary syndrome, aortic dissection, pulmonary embolism, cardiac tamponade, community-acquired pneumonia, pericarditis, musculoskeletal chest wall pain, etc.  Given the patient's current clinical presentation and objective findings I felt this most likely was not a life-threatening cause for chest pain at this time. Her EKG has occasional PVC but otherwise no acute ischemic changes are noted. We'll perform serial troponins which did not show any signs of ischemia. The patient's d-dimer significantly elevated and hence a chest CT was performed. The d-dimer may be elevated along with the cause of her pain from a large left-sided mass on her thyroid. His had a previous biopsy which was negative but has planned surgery to resect the left side of her thyroid gland. She otherwise  does not exhibit any signs or symptoms of bipolar or hyperthyroidism at this time. Patient was advised to results and the necessity to follow-up with her primary physician and also the surgeon and the plans for surgery. She most likely will have to have some form of preoperative evaluation of her heart prior to surgery.    Assessment:  Acute unspecified chest pain  Final Clinical Impression: Acute unspecified chest pain Large left-sided thyroid masses Final diagnoses:  None     Plan:  Outpatient management Patient was advised to return immediately if condition worsens. Patient was advised to follow up with her primary care physician or other specialized physicians involved and in their current assessment.             Jennye Moccasin, MD 08/18/15 [phone]

## 2015-08-18 NOTE — ED Notes (Signed)
Pt returned from CT await results  

## 2015-08-18 NOTE — ED Notes (Signed)
Patient transported to X-ray 

## 2015-08-18 NOTE — ED Notes (Signed)
Patient transported to CT 

## 2015-08-18 NOTE — Discharge Instructions (Signed)
Chest Pain (Nonspecific) °It is often hard to give a specific diagnosis for the cause of chest pain. There is always a chance that your pain could be related to something serious, such as a heart attack or a blood clot in the lungs. You need to follow up with your health care provider for further evaluation. °CAUSES  °· Heartburn. °· Pneumonia or bronchitis. °· Anxiety or stress. °· Inflammation around your heart (pericarditis) or lung (pleuritis or pleurisy). °· A blood clot in the lung. °· A collapsed lung (pneumothorax). It can develop suddenly on its own (spontaneous pneumothorax) or from trauma to the chest. °· Shingles infection (herpes zoster virus). °The chest wall is composed of bones, muscles, and cartilage. Any of these can be the source of the pain. °· The bones can be bruised by injury. °· The muscles or cartilage can be strained by coughing or overwork. °· The cartilage can be affected by inflammation and become sore (costochondritis). °DIAGNOSIS  °Lab tests or other studies may be needed to find the cause of your pain. Your health care provider may have you take a test called an ambulatory electrocardiogram (ECG). An ECG records your heartbeat patterns over a 24-hour period. You may also have other tests, such as: °· Transthoracic echocardiogram (TTE). During echocardiography, sound waves are used to evaluate how blood flows through your heart. °· Transesophageal echocardiogram (TEE). °· Cardiac monitoring. This allows your health care provider to monitor your heart rate and rhythm in real time. °· Holter monitor. This is a portable device that records your heartbeat and can help diagnose heart arrhythmias. It allows your health care provider to track your heart activity for several days, if needed. °· Stress tests by exercise or by giving medicine that makes the heart beat faster. °TREATMENT  °· Treatment depends on what may be causing your chest pain. Treatment may include: °¨ Acid blockers for  heartburn. °¨ Anti-inflammatory medicine. °¨ Pain medicine for inflammatory conditions. °¨ Antibiotics if an infection is present. °· You may be advised to change lifestyle habits. This includes stopping smoking and avoiding alcohol, caffeine, and chocolate. °· You may be advised to keep your head raised (elevated) when sleeping. This reduces the chance of acid going backward from your stomach into your esophagus. °Most of the time, nonspecific chest pain will improve within 2-3 days with rest and mild pain medicine.  °HOME CARE INSTRUCTIONS  °· If antibiotics were prescribed, take them as directed. Finish them even if you start to feel better. °· For the next few days, avoid physical activities that bring on chest pain. Continue physical activities as directed. °· Do not use any tobacco products, including cigarettes, chewing tobacco, or electronic cigarettes. °· Avoid drinking alcohol. °· Only take medicine as directed by your health care provider. °· Follow your health care provider's suggestions for further testing if your chest pain does not go away. °· Keep any follow-up appointments you made. If you do not go to an appointment, you could develop lasting (chronic) problems with pain. If there is any problem keeping an appointment, call to reschedule. °SEEK MEDICAL CARE IF:  °· Your chest pain does not go away, even after treatment. °· You have a rash with blisters on your chest. °· You have a fever. °SEEK IMMEDIATE MEDICAL CARE IF:  °· You have increased chest pain or pain that spreads to your arm, neck, jaw, back, or abdomen. °· You have shortness of breath. °· You have an increasing cough, or you cough   up blood.  You have severe back or abdominal pain.  You feel nauseous or vomit.  You have severe weakness.  You faint.  You have chills. This is an emergency. Do not wait to see if the pain will go away. Get medical help at once. Call your local emergency services (911 in [place] Do not drive  yourself to the hospital. MAKE SURE YOU:   Understand these instructions.  Will watch your condition.  Will get help right away if you are not doing well or get worse. Document Released: 08/19/2005 Document Revised: 11/14/2013 Document Reviewed: 06/14/2008 [facility] Patient Information 2015 [place], [place]. This information is not intended to replace advice given to you by your health care provider. Make sure you discuss any questions you have with your health care provider.  Please return immediately if condition worsens. Please contact her primary physician or the physician you were given for referral. If you have any specialist physicians involved in her treatment and plan please also contact them. Thank you for using [facility]. Concern is over the mass and your thyroid region is actually causing some impression on the trachea. Return emergency department if you have increasing shortness of breath, trouble with speech or swallowing, fever, or any other new concerns. Recommend further outpatient follow-up for the chest pain especially preoperative evaluation for her upcoming thyroid surgery.

## 2016-02-10 ENCOUNTER — Telehealth (HOSPITAL_COMMUNITY): Payer: Self-pay | Admitting: *Deleted

## 2016-02-10 NOTE — Telephone Encounter (Signed)
Patient returned call and does not qualify for [facility]. Patient has [employer] insurance. Advised patient to call and schedule mammogram.

## 2016-02-10 NOTE — Telephone Encounter (Signed)
Telephoned patient at home # and left message to return call to [facility] 

## 2016-02-24 ENCOUNTER — Other Ambulatory Visit: Payer: Self-pay

## 2016-02-24 DIAGNOSIS — Z1231 Encounter for screening mammogram for malignant neoplasm of breast: Secondary | ICD-10-CM

## 2016-02-26 ENCOUNTER — Ambulatory Visit: Payer: Self-pay | Admitting: Surgery

## 2016-03-11 ENCOUNTER — Ambulatory Visit
Admission: RE | Admit: 2016-03-11 | Discharge: 2016-03-11 | Disposition: A | Discharge status: Home / Self Care | Payer: BLUE CROSS/BLUE SHIELD | Source: Ambulatory Visit

## 2016-03-11 DIAGNOSIS — Z1231 Encounter for screening mammogram for malignant neoplasm of breast: Secondary | ICD-10-CM

## 2016-06-15 NOTE — Progress Notes (Signed)
Please place orders in EPIC as patient has a pre-op appointment with the nurse on  Tuesday 06/23/16 at 1100! Thank you!

## 2016-06-18 NOTE — Patient Instructions (Addendum)
HAILY HOULAHAN  06/18/2016   Your procedure is scheduled on: 06-25-16  Report to [facility]  Entrance take [facility]  elevators to 3rd floor to  Short Stay Center at 630  AM.  Call this number if you have problems the morning of surgery [phone]   Remember: ONLY 1 PERSON MAY GO WITH YOU TO SHORT STAY TO GET  READY MORNING OF YOUR SURGERY.  Do not eat food or drink liquids :After Midnight.     Take these medicines the morning of surgery with A SIP OF WATER:ES TYLENOL IF NEEDED                               You may not have any metal on your body including hair pins and              piercings  Do not wear jewelry, make-up, lotions, powders or perfumes, deodorant             Do not wear nail polish.  Do not shave  48 hours prior to surgery.              Men may shave face and neck.   Do not bring valuables to the hospital. [facility] IS NOT             RESPONSIBLE   FOR VALUABLES.  Contacts, dentures or bridgework may not be worn into surgery.  Leave suitcase in the car. After surgery it may be brought to your room.                  Please read over the following fact sheets you were given: _____________________________________________________________________             [facility] - Preparing for Surgery Before surgery, you can play an important role.  Because skin is not sterile, your skin needs to be as free of germs as possible.  You can reduce the number of germs on your skin by washing with CHG (chlorahexidine gluconate) soap before surgery.  CHG is an antiseptic cleaner which kills germs and bonds with the skin to continue killing germs even after washing. Please DO NOT use if you have an allergy to CHG or antibacterial soaps.  If your skin becomes reddened/irritated stop using the CHG and inform your nurse when you arrive at Short Stay. Do not shave (including legs and underarms) for at least 48 hours prior to the first CHG shower.  You may shave  your face/neck. Please follow these instructions carefully:  1.  Shower with CHG Soap the night before surgery and the  morning of Surgery.  2.  If you choose to wash your hair, wash your hair first as usual with your  normal  shampoo.  3.  After you shampoo, rinse your hair and body thoroughly to remove the  shampoo.                           4.  Use CHG as you would any other liquid soap.  You can apply chg directly  to the skin and wash                       Gently with a scrungie or clean washcloth.  5.  Apply the  CHG Soap to your body ONLY FROM THE NECK DOWN.   Do not use on face/ open                           Wound or open sores. Avoid contact with eyes, ears mouth and genitals (private parts).                       Wash face,  Genitals (private parts) with your normal soap.             6.  Wash thoroughly, paying special attention to the area where your surgery  will be performed.  7.  Thoroughly rinse your body with warm water from the neck down.  8.  DO NOT shower/wash with your normal soap after using and rinsing off  the CHG Soap.                9.  Pat yourself dry with a clean towel.            10.  Wear clean pajamas.            11.  Place clean sheets on your bed the night of your first shower and do not  sleep with pets. Day of Surgery : Do not apply any lotions/deodorants the morning of surgery.  Please wear clean clothes to the hospital/surgery center.  FAILURE TO FOLLOW THESE INSTRUCTIONS MAY RESULT IN THE CANCELLATION OF YOUR SURGERY PATIENT SIGNATURE_________________________________  NURSE SIGNATURE__________________________________  ________________________________________________________________________

## 2016-06-22 ENCOUNTER — Encounter (HOSPITAL_COMMUNITY): Payer: Self-pay | Admitting: Surgery

## 2016-06-22 ENCOUNTER — Ambulatory Visit: Payer: Self-pay | Admitting: Surgery

## 2016-06-22 NOTE — H&P (Signed)
  General Surgery [facility]  Isabel Chen DOB: 05/04/1956 Married / Language: English / Race: Black or African American Female  History of Present Illness  The patient is a 60 year old female who presents with a complaint of Enlarged thyroid.  Patient returns for preoperative visit for anticipated left thyroid lobectomy. Patient has an enlarged left thyroid mass which is causing tracheal deviation to the right and contributing to shortness of breath. CT scan from September 2016 is reviewed. Right thyroid lobe appears grossly normal. Patient presents today to discuss timing of surgery.   Allergies Tetracycline HCl *Tetracyclines** Hives.  Medication History No Current Medications Medications Reconciled  Vitals Weight: 334 lb Height: 69in Body Surface Area: 2.57 m Body Mass Index: 49.32 kg/m  Temp.: 97.18F  Pulse: 80 (Regular)  BP: 132/78 (Sitting, Left Arm, Standard)  Physical Exam  General - appears comfortable, no distress; not diaphorectic  HEENT - normocephalic; sclerae clear, gaze conjugate; mucous membranes moist, dentition good; voice normal  Neck - asymmetric on extension; no palpable anterior or posterior cervical adenopathy; soft mass involving left thyroid lobe, measuring approximately 7 x 5 cm and extending beneath the left clavicle, nontender; right thyroid lobe without palpable abnormality; larynx and airway deviation to the right  Chest - clear bilaterally without rhonchi, rales, or wheeze  Cor - regular rhythm with normal rate; no significant murmur  Ext - non-tender without significant edema or lymphedema  Neuro - grossly intact; no tremor   Assessment & Plan  THYROID MASS (E07.9)  Patient returns for preoperative assessment for anticipated left thyroid lobectomy. There appears to be an interval enlargement of the dominant left thyroid mass since her last evaluation here in August 2016. CT scan from  September 2016 does show mild interval enlargement of the dominant left sided mass. There is airway deviation to the right without impingement. Right thyroid lobe appears grossly normal.  Patient discussed the anticipated surgery. We plan left thyroid lobectomy. She will stay overnight. Risk and benefits are again reviewed. Patient understands and wishes to proceed in the near future.  The risks and benefits of the procedure have been discussed at length with the patient. The patient understands the proposed procedure, potential alternative treatments, and the course of recovery to be expected. All of the patient's questions have been answered at this time. The patient wishes to proceed with surgery.  Velora Heckler, MD, FACS General & Endocrine Surgery [facility] Office: [phone]

## 2016-06-22 NOTE — Progress Notes (Signed)
ekg 08-18-15 epic

## 2016-06-23 ENCOUNTER — Encounter (HOSPITAL_COMMUNITY)
Admission: RE | Admit: 2016-06-23 | Discharge: 2016-06-23 | Disposition: A | Discharge status: Home / Self Care | Payer: Self-pay | Source: Ambulatory Visit | Attending: Surgery | Admitting: Surgery

## 2016-06-23 ENCOUNTER — Encounter (HOSPITAL_COMMUNITY): Payer: Self-pay

## 2016-06-23 ENCOUNTER — Ambulatory Visit (HOSPITAL_COMMUNITY)
Admission: RE | Admit: 2016-06-23 | Discharge: 2016-06-23 | Disposition: A | Discharge status: Home / Self Care | Payer: Self-pay | Source: Ambulatory Visit | Attending: Anesthesiology | Admitting: Anesthesiology

## 2016-06-23 DIAGNOSIS — J398 Other specified diseases of upper respiratory tract: Secondary | ICD-10-CM | POA: Insufficient documentation

## 2016-06-23 DIAGNOSIS — Z01818 Encounter for other preprocedural examination: Secondary | ICD-10-CM

## 2016-06-23 DIAGNOSIS — E049 Nontoxic goiter, unspecified: Secondary | ICD-10-CM | POA: Insufficient documentation

## 2016-06-23 HISTORY — DX: Pneumonia, unspecified organism: J18.9

## 2016-06-23 HISTORY — DX: Personal history of other diseases of the respiratory system: Z87.09

## 2016-06-23 HISTORY — DX: Cardiac murmur, unspecified: R01.1

## 2016-06-23 HISTORY — DX: Gastro-esophageal reflux disease without esophagitis: K21.9

## 2016-06-23 LAB — CBC
HEMATOCRIT: 37 % (ref 36.0–46.0)
Hemoglobin: 12.1 g/dL (ref 12.0–15.0)
MCH: 27.1 pg (ref 26.0–34.0)
MCHC: 32.7 g/dL (ref 30.0–36.0)
MCV: 83 fL (ref 78.0–100.0)
PLATELETS: 344 10*3/uL (ref 150–400)
RBC: 4.46 MIL/uL (ref 3.87–5.11)
RDW: 15.9 % — AB (ref 11.5–15.5)
WBC: 8.8 10*3/uL (ref 4.0–10.5)

## 2016-06-23 NOTE — Progress Notes (Signed)
Routed 06-23-16 chest xray results to dr todd gerkin and made dr Acey Lav anesthesia aware, no orders received from dr Acey Lav

## 2016-06-24 MED ORDER — DEXTROSE 5 % IV SOLN
3.0000 g | INTRAVENOUS | Status: AC
  Administered 2016-06-25: 3 g via INTRAVENOUS
  Filled 2016-06-24: qty 3

## 2016-06-25 ENCOUNTER — Encounter (HOSPITAL_COMMUNITY): Payer: Self-pay | Admitting: *Deleted

## 2016-06-25 ENCOUNTER — Ambulatory Visit (HOSPITAL_COMMUNITY): Payer: Self-pay | Admitting: Certified Registered Nurse Anesthetist

## 2016-06-25 ENCOUNTER — Encounter (HOSPITAL_COMMUNITY)
Admission: RE | Disposition: A | Discharge status: Home / Self Care | Payer: Self-pay | Source: Ambulatory Visit | Attending: Surgery

## 2016-06-25 ENCOUNTER — Observation Stay (HOSPITAL_COMMUNITY)
Admission: RE | Admit: 2016-06-25 | Discharge: 2016-06-26 | Disposition: A | Discharge status: Home / Self Care | Payer: Self-pay | Source: Ambulatory Visit | Attending: Surgery | Admitting: Surgery

## 2016-06-25 DIAGNOSIS — Z6841 Body Mass Index (BMI) 40.0 and over, adult: Secondary | ICD-10-CM | POA: Insufficient documentation

## 2016-06-25 DIAGNOSIS — E049 Nontoxic goiter, unspecified: Secondary | ICD-10-CM | POA: Diagnosis present

## 2016-06-25 DIAGNOSIS — Z87891 Personal history of nicotine dependence: Secondary | ICD-10-CM | POA: Insufficient documentation

## 2016-06-25 DIAGNOSIS — Z791 Long term (current) use of non-steroidal anti-inflammatories (NSAID): Secondary | ICD-10-CM | POA: Insufficient documentation

## 2016-06-25 DIAGNOSIS — E042 Nontoxic multinodular goiter: Principal | ICD-10-CM | POA: Insufficient documentation

## 2016-06-25 HISTORY — PX: THYROID LOBECTOMY: SHX420

## 2016-06-25 SURGERY — LOBECTOMY, THYROID
Anesthesia: General | Site: Neck | Laterality: Left

## 2016-06-25 MED ORDER — ROCURONIUM BROMIDE 100 MG/10ML IV SOLN
INTRAVENOUS | Status: AC
  Filled 2016-06-25: qty 1

## 2016-06-25 MED ORDER — 0.9 % SODIUM CHLORIDE (POUR BTL) OPTIME
TOPICAL | Status: DC | PRN
  Administered 2016-06-25: 1000 mL

## 2016-06-25 MED ORDER — PROPOFOL 10 MG/ML IV BOLUS
INTRAVENOUS | Status: AC
  Filled 2016-06-25: qty 20

## 2016-06-25 MED ORDER — ONDANSETRON 4 MG PO TBDP: 4.0000 mg | ORAL_TABLET | Freq: Four times a day (QID) | ORAL | Status: DC | PRN

## 2016-06-25 MED ORDER — PROPOFOL 10 MG/ML IV BOLUS
INTRAVENOUS | Status: DC | PRN
  Administered 2016-06-25: 200 mg via INTRAVENOUS

## 2016-06-25 MED ORDER — MIDAZOLAM HCL 5 MG/5ML IJ SOLN
INTRAMUSCULAR | Status: DC | PRN
  Administered 2016-06-25: 2 mg via INTRAVENOUS

## 2016-06-25 MED ORDER — SUGAMMADEX SODIUM 200 MG/2ML IV SOLN
INTRAVENOUS | Status: DC | PRN
  Administered 2016-06-25: 200 mg via INTRAVENOUS

## 2016-06-25 MED ORDER — PHENYLEPHRINE 40 MCG/ML (10ML) SYRINGE FOR IV PUSH (FOR BLOOD PRESSURE SUPPORT)
PREFILLED_SYRINGE | INTRAVENOUS | Status: AC
  Filled 2016-06-25: qty 10

## 2016-06-25 MED ORDER — SUCCINYLCHOLINE CHLORIDE 20 MG/ML IJ SOLN
INTRAMUSCULAR | Status: DC | PRN
  Administered 2016-06-25: 160 mg via INTRAVENOUS

## 2016-06-25 MED ORDER — FENTANYL CITRATE (PF) 100 MCG/2ML IJ SOLN: 25.0000 ug | INTRAMUSCULAR | Status: DC | PRN

## 2016-06-25 MED ORDER — ACETAMINOPHEN 650 MG RE SUPP: 650.0000 mg | Freq: Four times a day (QID) | RECTAL | Status: DC | PRN

## 2016-06-25 MED ORDER — DEXAMETHASONE SODIUM PHOSPHATE 10 MG/ML IJ SOLN
INTRAMUSCULAR | Status: DC | PRN
  Administered 2016-06-25: 10 mg via INTRAVENOUS

## 2016-06-25 MED ORDER — MIDAZOLAM HCL 2 MG/2ML IJ SOLN: 0.5000 mg | Freq: Once | INTRAMUSCULAR | Status: DC | PRN

## 2016-06-25 MED ORDER — LIDOCAINE HCL (CARDIAC) 20 MG/ML IV SOLN
INTRAVENOUS | Status: AC
  Filled 2016-06-25: qty 5

## 2016-06-25 MED ORDER — LACTATED RINGERS IV SOLN
INTRAVENOUS | Status: DC
  Administered 2016-06-25 (×2): via INTRAVENOUS

## 2016-06-25 MED ORDER — HYDROCODONE-ACETAMINOPHEN 5-325 MG PO TABS
1.0000 | ORAL_TABLET | ORAL | Status: DC | PRN
  Administered 2016-06-25 – 2016-06-26 (×4): 1 via ORAL
  Filled 2016-06-25 (×2): qty 1
  Filled 2016-06-25: qty 2
  Filled 2016-06-25: qty 1

## 2016-06-25 MED ORDER — CHLORHEXIDINE GLUCONATE CLOTH 2 % EX PADS: 6.0000 | MEDICATED_PAD | Freq: Once | CUTANEOUS | Status: DC

## 2016-06-25 MED ORDER — PHENYLEPHRINE HCL 10 MG/ML IJ SOLN
INTRAMUSCULAR | Status: DC | PRN
  Administered 2016-06-25 (×2): 40 ug via INTRAVENOUS

## 2016-06-25 MED ORDER — MEPERIDINE HCL 50 MG/ML IJ SOLN: 6.2500 mg | INTRAMUSCULAR | Status: DC | PRN

## 2016-06-25 MED ORDER — ACETAMINOPHEN 325 MG PO TABS: 650.0000 mg | ORAL_TABLET | Freq: Four times a day (QID) | ORAL | Status: DC | PRN

## 2016-06-25 MED ORDER — FENTANYL CITRATE (PF) 100 MCG/2ML IJ SOLN
INTRAMUSCULAR | Status: DC | PRN
  Administered 2016-06-25 (×5): 50 ug via INTRAVENOUS

## 2016-06-25 MED ORDER — SUGAMMADEX SODIUM 200 MG/2ML IV SOLN
INTRAVENOUS | Status: AC
  Filled 2016-06-25: qty 2

## 2016-06-25 MED ORDER — ONDANSETRON HCL 4 MG/2ML IJ SOLN: 4.0000 mg | Freq: Four times a day (QID) | INTRAMUSCULAR | Status: DC | PRN

## 2016-06-25 MED ORDER — FENTANYL CITRATE (PF) 250 MCG/5ML IJ SOLN
INTRAMUSCULAR | Status: AC
  Filled 2016-06-25: qty 5

## 2016-06-25 MED ORDER — ONDANSETRON HCL 4 MG/2ML IJ SOLN
INTRAMUSCULAR | Status: DC | PRN
  Administered 2016-06-25: 4 mg via INTRAVENOUS

## 2016-06-25 MED ORDER — PROMETHAZINE HCL 25 MG/ML IJ SOLN: 6.2500 mg | INTRAMUSCULAR | Status: DC | PRN

## 2016-06-25 MED ORDER — HYDROMORPHONE HCL 1 MG/ML IJ SOLN
1.0000 mg | INTRAMUSCULAR | Status: DC | PRN
Start: 2016-06-25 — End: 2016-06-26
  Administered 2016-06-25: 1 mg via INTRAVENOUS
  Filled 2016-06-25: qty 1

## 2016-06-25 MED ORDER — ONDANSETRON HCL 4 MG/2ML IJ SOLN
INTRAMUSCULAR | Status: AC
  Filled 2016-06-25: qty 2

## 2016-06-25 MED ORDER — KCL IN DEXTROSE-NACL 20-5-0.45 MEQ/L-%-% IV SOLN
INTRAVENOUS | Status: DC
  Administered 2016-06-25 – 2016-06-26 (×2): via INTRAVENOUS
  Filled 2016-06-25 (×2): qty 1000

## 2016-06-25 MED ORDER — MIDAZOLAM HCL 2 MG/2ML IJ SOLN
INTRAMUSCULAR | Status: AC
  Filled 2016-06-25: qty 2

## 2016-06-25 MED ORDER — DEXAMETHASONE SODIUM PHOSPHATE 10 MG/ML IJ SOLN
INTRAMUSCULAR | Status: AC
  Filled 2016-06-25: qty 1

## 2016-06-25 MED ORDER — ROCURONIUM BROMIDE 100 MG/10ML IV SOLN
INTRAVENOUS | Status: DC | PRN
  Administered 2016-06-25: 20 mg via INTRAVENOUS
  Administered 2016-06-25: 50 mg via INTRAVENOUS

## 2016-06-25 MED ORDER — HYDROMORPHONE HCL 1 MG/ML IJ SOLN
INTRAMUSCULAR | Status: DC | PRN
  Administered 2016-06-25 (×2): 0.5 mg via INTRAVENOUS
  Administered 2016-06-25: 1 mg via INTRAVENOUS

## 2016-06-25 MED ORDER — LIDOCAINE HCL (CARDIAC) 20 MG/ML IV SOLN
INTRAVENOUS | Status: DC | PRN
  Administered 2016-06-25: 40 mg via INTRAVENOUS

## 2016-06-25 MED ORDER — HYDROMORPHONE HCL 2 MG/ML IJ SOLN
INTRAMUSCULAR | Status: AC
  Filled 2016-06-25: qty 1

## 2016-06-25 SURGICAL SUPPLY — 39 items
APL SKNCLS STERI-STRIP NONHPOA (GAUZE/BANDAGES/DRESSINGS) ×1
ATTRACTOMAT 16X20 MAGNETIC DRP (DRAPES) ×3 IMPLANT
BENZOIN TINCTURE PRP APPL 2/3 (GAUZE/BANDAGES/DRESSINGS) ×2 IMPLANT
BLADE HEX COATED 2.75 (ELECTRODE) ×3 IMPLANT
BLADE SURG 15 STRL LF DISP TIS (BLADE) ×1 IMPLANT
BLADE SURG 15 STRL SS (BLADE) ×3
CHLORAPREP W/TINT 26ML (MISCELLANEOUS) ×3 IMPLANT
CLIP TI MEDIUM 6 (CLIP) ×6 IMPLANT
CLIP TI WIDE RED SMALL 6 (CLIP) ×6 IMPLANT
CLOSURE WOUND 1/2 X4 (GAUZE/BANDAGES/DRESSINGS) ×1
COVER SURGICAL LIGHT HANDLE (MISCELLANEOUS) ×3 IMPLANT
DISSECTOR ROUND CHERRY 3/8 STR (MISCELLANEOUS) IMPLANT
DRAPE LAPAROTOMY T 98X78 PEDS (DRAPES) ×3 IMPLANT
DRESSING SURGICEL FIBRLLR 1X2 (HEMOSTASIS) ×1 IMPLANT
DRSG SURGICEL FIBRILLAR 1X2 (HEMOSTASIS) ×3
ELECT PENCIL ROCKER SW 15FT (MISCELLANEOUS) ×3 IMPLANT
ELECT REM PT RETURN 9FT ADLT (ELECTROSURGICAL) ×3
ELECTRODE REM PT RTRN 9FT ADLT (ELECTROSURGICAL) ×1 IMPLANT
GAUZE SPONGE 4X4 12PLY STRL (GAUZE/BANDAGES/DRESSINGS) IMPLANT
GAUZE SPONGE 4X4 16PLY XRAY LF (GAUZE/BANDAGES/DRESSINGS) ×9 IMPLANT
GLOVE SURG ORTHO 8.0 STRL STRW (GLOVE) ×3 IMPLANT
GOWN STRL REUS W/TWL XL LVL3 (GOWN DISPOSABLE) ×9 IMPLANT
KIT BASIN OR (CUSTOM PROCEDURE TRAY) ×3 IMPLANT
LIGHT WAVEGUIDE WIDE FLAT (MISCELLANEOUS) ×2 IMPLANT
LIQUID BAND (GAUZE/BANDAGES/DRESSINGS) IMPLANT
PACK BASIC VI WITH GOWN DISP (CUSTOM PROCEDURE TRAY) ×3 IMPLANT
SHEARS HARMONIC 9CM CVD (BLADE) ×3 IMPLANT
STAPLER VISISTAT 35W (STAPLE) ×3 IMPLANT
STRIP CLOSURE SKIN 1/2X4 (GAUZE/BANDAGES/DRESSINGS) ×1 IMPLANT
SUT MNCRL AB 4-0 PS2 18 (SUTURE) ×3 IMPLANT
SUT SILK 2 0 (SUTURE) ×3
SUT SILK 2-0 18XBRD TIE 12 (SUTURE) ×1 IMPLANT
SUT SILK 3 0 (SUTURE)
SUT SILK 3-0 18XBRD TIE 12 (SUTURE) IMPLANT
SUT VIC AB 3-0 SH 18 (SUTURE) ×3 IMPLANT
SYR BULB IRRIGATION 50ML (SYRINGE) ×3 IMPLANT
TAPE CLOTH SURG 4X10 WHT LF (GAUZE/BANDAGES/DRESSINGS) ×3 IMPLANT
TOWEL OR 17X26 10 PK STRL BLUE (TOWEL DISPOSABLE) ×3 IMPLANT
YANKAUER SUCT BULB TIP 10FT TU (MISCELLANEOUS) ×3 IMPLANT

## 2016-06-25 NOTE — Anesthesia Preprocedure Evaluation (Addendum)
Anesthesia Evaluation  Patient identified by MRN, date of birth, ID band Patient awake    Reviewed: Allergy & Precautions, NPO status , Patient's Chart, lab work & pertinent test results  History of Anesthesia Complications Negative for: history of anesthetic complications  Airway Mallampati: II  TM Distance: >3 FB Neck ROM: Full    Dental  (+) Missing, Chipped, Dental Advisory Given   Pulmonary former smoker (quit 1985),    breath sounds clear to auscultation       Cardiovascular negative cardio ROS   Rhythm:Regular Rate:Normal     Neuro/Psych negative neurological ROS     GI/Hepatic Neg liver ROS, GERD  Controlled,  Endo/Other  Morbid obesity  Renal/GU negative Renal ROS     Musculoskeletal  (+) Arthritis ,   Abdominal (+) + obese,   Peds  Hematology negative hematology ROS (+)   Anesthesia Other Findings   Reproductive/Obstetrics                            Anesthesia Physical Anesthesia Plan  ASA: III  Anesthesia Plan: General   Post-op Pain Management:    Induction: Intravenous  Airway Management Planned: Oral ETT  Additional Equipment:   Intra-op Plan:   Post-operative Plan: Extubation in OR  Informed Consent: I have reviewed the patients History and Physical, chart, labs and discussed the procedure including the risks, benefits and alternatives for the proposed anesthesia with the patient or authorized representative who has indicated his/her understanding and acceptance.   Dental advisory given  Plan Discussed with: CRNA and Surgeon  Anesthesia Plan Comments: (Plan routine monitors, GETA)        Anesthesia Quick Evaluation

## 2016-06-25 NOTE — Transfer of Care (Signed)
Immediate Anesthesia Transfer of Care Note  Patient: Isabel Chen  Procedure(s) Performed: Procedure(s): LEFT THYROID LOBECTOMY (Left)  Patient Location: PACU  Anesthesia Type:General  Level of Consciousness:  sedated, patient cooperative and responds to stimulation  Airway & Oxygen Therapy:Patient Spontanous Breathing and Patient connected to face mask oxgen  Post-op Assessment:  Report given to PACU RN and Post -op Vital signs reviewed and stable  Post vital signs:  Reviewed and stable  Last Vitals:  Vitals:   06/25/16 0618  BP: (!) 158/87  Pulse: 76  Resp: 18  Temp: 36.6 C    Complications: No apparent anesthesia complications

## 2016-06-25 NOTE — Anesthesia Procedure Notes (Signed)
Procedure Name: Intubation Date/Time: 06/25/2016 9:39 AM Performed by: Epimenio Sarin Pre-anesthesia Checklist: Patient identified, Emergency Drugs available, Suction available, Patient being monitored and Timeout performed Patient Re-evaluated:Patient Re-evaluated prior to inductionOxygen Delivery Method: Circle system utilized Preoxygenation: Pre-oxygenation with 100% oxygen Intubation Type: IV induction and Rapid sequence Laryngoscope Size: Mac and 3 Grade View: Grade I Tube type: Oral Tube size: 7.5 mm Number of attempts: 1 Airway Equipment and Method: Stylet Placement Confirmation: ETT inserted through vocal cords under direct vision,  positive ETCO2 and breath sounds checked- equal and bilateral Secured at: 23 cm Tube secured with: Tape Dental Injury: Teeth and Oropharynx as per pre-operative assessment

## 2016-06-25 NOTE — Brief Op Note (Signed)
06/25/2016  10:22 AM  PATIENT:  Jones Bales  60 y.o. female  PRE-OPERATIVE DIAGNOSIS:  left thyroid mass with substernal extension  POST-OPERATIVE DIAGNOSIS:  left thyroid mass with substernal extension  PROCEDURE:  Procedure(s): LEFT THYROID LOBECTOMY (Left)  SURGEON:  Surgeon(s) and Role:    * Darnell Level, MD - Primary  ANESTHESIA:   general  EBL:  Total I/O In: 1000 [I.V.:1000] Out: 25 [Blood:25]  BLOOD ADMINISTERED:none  DRAINS: none   LOCAL MEDICATIONS USED:  MARCAINE     SPECIMEN:  Excision  DISPOSITION OF SPECIMEN:  PATHOLOGY  COUNTS:  YES  TOURNIQUET:  * No tourniquets in log *  DICTATION: .Other Dictation: Dictation Number [id number]  PLAN OF CARE: Admit for overnight observation  PATIENT DISPOSITION:  PACU - hemodynamically stable.   Delay start of Pharmacological VTE agent (>24hrs) due to surgical blood loss or risk of bleeding: yes  Velora Heckler, MD, [facility] Office: [phone]

## 2016-06-25 NOTE — Op Note (Signed)
NAMEAILENE, Isabel Chen NO.:  [id number]  MEDICAL RECORD NO.:  [id number]  LOCATION:  [facility]                         FACILITY:  [facility]  PHYSICIAN:  Velora Heckler, MD      DATE OF BIRTH:  December 11, 1955  DATE OF PROCEDURE:  25 June 2016                              OPERATIVE REPORT  PREOP DIAGNOSIS:  Substernal left thyroid goiter.  POSTOP DIAGNOSIS:  Substernal left thyroid goiter.  PROCEDURE:  Left thyroid lobectomy.  SURGEON:  Darnell Level, MD, FACS  ANESTHESIA:  General.  ESTIMATED BLOOD LOSS:  Minimal.  PREPARATION:  ChloraPrep.  COMPLICATIONS:  None.  INDICATIONS:  The patient is a 60 year old female referred by her primary care physician for enlarged left thyroid lobe.  The patient had undergone diagnostic studies including a CT scan.  This showed an enlarged left thyroid mass causing tracheal deviation to the right and shortness of breath.  The patient now comes to Surgery for excision.  BODY OF REPORT:  Procedure was done in OR #4 at the [facility].  The patient was brought to the operating room, placed in supine position on the operating room table.  Following administration of general anesthesia, the patient was positioned and then prepped and draped in the usual aseptic fashion.  After ascertaining that an adequate level of anesthesia had been achieved, a Kocher incision was made with a #15 blade.  Dissection carried through subcutaneous tissues and platysma and hemostasis achieved with the electrocautery.  External jugular veins were ligated in continuity with 2-0 silk ties and divided.  Skin flaps were elevated cephalad and caudad from the thyroid notch to the sternal notch and a Mahorner self- retaining retractor was placed for exposure.  Strap muscles were incised in the midline.  Dissection was begun on the left.  Strap muscles were reflected laterally exposing the markedly enlarged left thyroid lobe.  Lobe was gently  mobilized with blunt dissection.  Venous tributaries were divided between Ligaclips with the Harmonic scalpel.  Superior pole was dissected out.  There was also an enlarged mass in the left side of the thyroid isthmus which overlies the anterior left thyroid cartilage.  This was gently mobilized.  Vascular structures were divided between Ligaclips with the Harmonic scalpel. Left lobe extends well into the posterior mediastinum.  It was gently mobilized with blunt dissection and venous tributaries divided between Ligaclips with the Harmonic scalpel.  Gland was rolled anteriorly.  What appears to be the recurrent laryngeal nerve was adherent to the lateral aspect of the goiter.  It travels along the lateral aspect of the goiter for approximately 4-5 cm.  With gentle dissection, it was mobilized off the surface of the gland and mobilized posteriorly back towards the lateral edge of the esophagus.  It was traced superiorly to the cricothyroideus muscle and preserved along its course.  Branches of the inferior thyroid artery were divided between small and medium Ligaclips and divided with the Harmonic scalpel.  Parathyroid tissue was identified and preserved on its vascular pedicle.  Gland was rolled further anteriorly and the ligament of Allyson Sabal was released with the electrocautery.  Small vessels were divided between small Ligaclips. Gland was  then mobilized on to the anterior trachea and across the midline.  Again the pyramidal lobe was resected with the isthmus. Isthmus was mobilized across the midline and then transected with the Harmonic scalpel at the junction of the isthmus and right thyroid lobe. The entire left lobe and isthmus was submitted to Pathology for review.  Left neck was irrigated copiously with warm saline.  Good hemostasis was achieved with the electrocautery and with application of small Ligaclips.  Fibrillar was placed throughout the operative field.  Strap muscles were  reapproximated in the midline with interrupted 3-0 Vicryl sutures.  Platysma was closed with interrupted 3-0 Vicryl sutures.  Skin was closed with a running 4-0 Monocryl subcuticular suture.  Wound was washed and dried and benzoin and Steri-Strips were applied.  Sterile dressings were applied.  The patient was awakened from anesthesia and brought to the recovery room.  The patient tolerated the procedure well.   Velora Heckler, MD, FACS General & Endocrine Surgery [facility] Office: [phone]   TMG/MEDQ  D:  06/25/2016  T:  06/25/2016  Job:  [id number]

## 2016-06-25 NOTE — Interval H&P Note (Signed)
History and Physical Interval Note:  06/25/2016 8:24 AM  Isabel Chen  has presented today for surgery, with the diagnosis of left thyroid mass with substernal extension.  The various methods of treatment have been discussed with the patient and family. After consideration of risks, benefits and other options for treatment, the patient has consented to    Procedure(s): LEFT THYROID LOBECTOMY (Left) as a surgical intervention .    The patient's history has been reviewed, patient examined, no change in status, stable for surgery.  I have reviewed the patient's chart and labs.  Questions were answered to the patient's satisfaction.    Velora Heckler, MD, [facility] Office: [phone]    Issa Kosmicki Judie Petit

## 2016-06-25 NOTE — Anesthesia Postprocedure Evaluation (Signed)
Anesthesia Post Note  Patient: Isabel Chen  Procedure(s) Performed: Procedure(s) (LRB): LEFT THYROID LOBECTOMY (Left)  Patient location during evaluation: PACU Anesthesia Type: General Level of consciousness: awake and alert, oriented and patient cooperative Pain management: pain level controlled Vital Signs Assessment: post-procedure vital signs reviewed and stable Respiratory status: spontaneous breathing, nonlabored ventilation and respiratory function stable Cardiovascular status: blood pressure returned to baseline and stable Postop Assessment: no signs of nausea or vomiting Anesthetic complications: no    Last Vitals:  Vitals:   06/25/16 1045 06/25/16 1100  BP: (!) 146/79 (!) 157/85  Pulse: 84 84  Resp: 15 16  Temp:  36.5 C    Last Pain:  Vitals:   06/25/16 1100  TempSrc:   PainSc: Asleep                 Deborra Phegley,E. Veona Bittman

## 2016-06-26 MED ORDER — HYDROCODONE-ACETAMINOPHEN 5-325 MG PO TABS: 1.0000 | ORAL_TABLET | ORAL | 0 refills | Status: DC | PRN

## 2016-06-26 NOTE — Discharge Summary (Signed)
Physician Discharge Summary [facility]  Patient ID: Isabel Chen MRN: [id number] DOB/AGE: 1956/04/07 60 y.o.  Admit date: 06/25/2016 Discharge date: 06/26/2016  Admission Diagnoses:  Substernal thyroid goiter  Discharge Diagnoses:  Principal Problem:   Substernal thyroid goiter   Discharged Condition: good  Hospital Course: Patient was admitted for observation following thyroid surgery.  Post op course was uncomplicated.  Pain was well controlled.  Tolerated diet.  Patient was prepared for discharge home on POD#1.  Consults: None  Treatments: surgery: left thyroid lobectomy  Discharge Exam: Blood pressure (!) 165/78, pulse 79, temperature 98.3 F (36.8 C), temperature source Oral, resp. rate 18, height 5\' 10"  (1.778 m), weight (!) 151.5 kg (334 lb), SpO2 95 %. HEENT - clear Neck - wound dry and intact, mild STS; moderate hoarseness Chest - clear bilaterally Cor - RRR   Disposition: Home  Discharge Instructions    Apply dressing    Complete by:  As directed   Apply light gauze dressing to wound before discharge home today.   Diet - low sodium heart healthy    Complete by:  As directed   Discharge instructions    Complete by:  As directed   [facility]  THYROID & PARATHYROID SURGERY:  POST-OP INSTRUCTIONS  Always review your discharge instruction sheet from the facility where your surgery was performed.  A prescription for pain medication may be given to you upon discharge.  Take your pain medication as prescribed.  If narcotic pain medicine is not needed, then you may take acetaminophen (Tylenol) or ibuprofen (Advil) as needed.  Take your usually prescribed medications unless otherwise directed.  If you need a refill on your pain medication, please contact your pharmacy. They will contact our office to request authorization.  Prescriptions will not be processed by our office after 5 pm or on weekends.  Start with a light diet  upon arrival home, such as soup and crackers or toast.  Be sure to drink plenty of fluids daily.  Resume your normal diet the day after surgery.  Most patients will experience some swelling and bruising on the chest and neck area.  Ice packs will help.  Swelling and bruising can take several days to resolve.   It is common to experience some constipation after surgery.  Increasing fluid intake and taking a stool softener will usually help or prevent this problem.  A mild laxative (Milk of Magnesia or Miralax) should be taken according to package directions if there has been no bowel movement after 48 hours.  You have steri-strips and a gauze dressing over your incision.  You may remove the gauze bandage on the second day after surgery, and you may shower at that time.  Leave your steri-strips (small skin tapes) in place directly over the incision.  These strips should remain on the skin for 5-7 days and then be removed.  You may get them wet in the shower and pat them dry.  You may resume regular (light) daily activities beginning the next day - such as daily self-care, walking, climbing stairs - gradually increasing activities as tolerated.  You may have sexual intercourse when it is comfortable.  Refrain from any heavy lifting or straining until approved by your doctor.  You may drive when you no longer are taking prescription pain medication, you can comfortably wear a seatbelt, and you can safely maneuver your car and apply brakes.  You should see your doctor in the office for a  follow-up appointment approximately two to three weeks after your surgery.  Make sure that you call for this appointment within a day or two after you arrive home to insure a convenient appointment time.  WHEN TO CALL YOUR DOCTOR: -- Fever greater than 101.5 -- Inability to urinate -- Nausea and/or vomiting - persistent -- Extreme swelling or bruising -- Continued bleeding from incision -- Increased pain, redness, or  drainage from the incision -- Difficulty swallowing or breathing -- Muscle cramping or spasms -- Numbness or tingling in hands or around lips  The clinic staff is available to answer your questions during regular business hours.  Please don't hesitate to call and ask to speak to one of the nurses if you have concerns.  Velora Heckler, MD, FACS General & Endocrine Surgery [facility] Office: [phone]  Website: [email or web address]   Increase activity slowly    Complete by:  As directed   Remove dressing in 24 hours    Complete by:  As directed       Medication List    TAKE these medications   acetaminophen 500 MG tablet Commonly known as:  TYLENOL Take 500 mg by mouth every 6 (six) hours as needed.   ALEVE PO Take 1 tablet by mouth daily.   BC FAST PAIN RELIEF 650-195-33.3 MG Pack Generic drug:  Aspirin-Salicylamide-Caffeine Take by mouth as needed.   HYDROcodone-acetaminophen 5-325 MG tablet Commonly known as:  NORCO/VICODIN Take 1-2 tablets by mouth every 4 (four) hours as needed for moderate pain.   permethrin 5 % cream Commonly known as:  ELIMITE Apply to entire body, then wash off after 8-14 hours. THEN, repeat again in 1 week. Wash all clothing and bedding in hot water, treat close contacts as well.   traMADol 50 MG tablet Commonly known as:  ULTRAM Take 1 tablet (50 mg total) by mouth every 6 (six) hours as needed.        Velora Heckler, MD, [facility] Office: [phone]   Signed: Velora Heckler 06/26/2016, 9:31 AM

## 2016-06-26 NOTE — Progress Notes (Signed)
Pt VSS. IV removed. Light gauze dressing applied over incision site. AVS reviewed at bedside w/ follow up care instructions. No further questions from pt or family present. Justin Mend, RN

## 2016-06-28 NOTE — Progress Notes (Signed)
Please contact patient and notify of benign pathology results.  Wei Poplaski M. Riane Rung, MD, FACS [facility] Office: [phone]   

## 2018-10-25 ENCOUNTER — Institutional Professional Consult (permissible substitution): Payer: Self-pay | Admitting: Internal Medicine

## 2018-10-29 ENCOUNTER — Emergency Department (HOSPITAL_COMMUNITY): Payer: Self-pay

## 2018-10-29 ENCOUNTER — Encounter (HOSPITAL_COMMUNITY): Payer: Self-pay

## 2018-10-29 ENCOUNTER — Other Ambulatory Visit: Payer: Self-pay

## 2018-10-29 ENCOUNTER — Observation Stay (HOSPITAL_COMMUNITY)
Admission: EM | Admit: 2018-10-29 | Discharge: 2018-10-30 | Disposition: A | Discharge status: Home / Self Care | Payer: Self-pay | Attending: Internal Medicine | Admitting: Internal Medicine

## 2018-10-29 ENCOUNTER — Emergency Department (HOSPITAL_BASED_OUTPATIENT_CLINIC_OR_DEPARTMENT_OTHER): Payer: Self-pay

## 2018-10-29 DIAGNOSIS — I5030 Unspecified diastolic (congestive) heart failure: Secondary | ICD-10-CM | POA: Insufficient documentation

## 2018-10-29 DIAGNOSIS — Z79899 Other long term (current) drug therapy: Secondary | ICD-10-CM

## 2018-10-29 DIAGNOSIS — Z888 Allergy status to other drugs, medicaments and biological substances status: Secondary | ICD-10-CM

## 2018-10-29 DIAGNOSIS — I071 Rheumatic tricuspid insufficiency: Secondary | ICD-10-CM | POA: Insufficient documentation

## 2018-10-29 DIAGNOSIS — Z881 Allergy status to other antibiotic agents status: Secondary | ICD-10-CM | POA: Insufficient documentation

## 2018-10-29 DIAGNOSIS — R609 Edema, unspecified: Secondary | ICD-10-CM

## 2018-10-29 DIAGNOSIS — I110 Hypertensive heart disease with heart failure: Secondary | ICD-10-CM | POA: Insufficient documentation

## 2018-10-29 DIAGNOSIS — R079 Chest pain, unspecified: Secondary | ICD-10-CM

## 2018-10-29 DIAGNOSIS — E032 Hypothyroidism due to medicaments and other exogenous substances: Secondary | ICD-10-CM | POA: Insufficient documentation

## 2018-10-29 DIAGNOSIS — Z87891 Personal history of nicotine dependence: Secondary | ICD-10-CM | POA: Insufficient documentation

## 2018-10-29 DIAGNOSIS — D649 Anemia, unspecified: Secondary | ICD-10-CM | POA: Insufficient documentation

## 2018-10-29 DIAGNOSIS — R0609 Other forms of dyspnea: Secondary | ICD-10-CM | POA: Insufficient documentation

## 2018-10-29 DIAGNOSIS — Z23 Encounter for immunization: Secondary | ICD-10-CM | POA: Insufficient documentation

## 2018-10-29 DIAGNOSIS — M7989 Other specified soft tissue disorders: Secondary | ICD-10-CM | POA: Insufficient documentation

## 2018-10-29 DIAGNOSIS — Z79890 Hormone replacement therapy (postmenopausal): Secondary | ICD-10-CM | POA: Insufficient documentation

## 2018-10-29 DIAGNOSIS — E785 Hyperlipidemia, unspecified: Secondary | ICD-10-CM | POA: Insufficient documentation

## 2018-10-29 DIAGNOSIS — R52 Pain, unspecified: Secondary | ICD-10-CM

## 2018-10-29 DIAGNOSIS — K219 Gastro-esophageal reflux disease without esophagitis: Secondary | ICD-10-CM | POA: Insufficient documentation

## 2018-10-29 DIAGNOSIS — M1990 Unspecified osteoarthritis, unspecified site: Secondary | ICD-10-CM | POA: Insufficient documentation

## 2018-10-29 DIAGNOSIS — R0600 Dyspnea, unspecified: Secondary | ICD-10-CM

## 2018-10-29 DIAGNOSIS — Z8249 Family history of ischemic heart disease and other diseases of the circulatory system: Secondary | ICD-10-CM | POA: Insufficient documentation

## 2018-10-29 DIAGNOSIS — E755 Other lipid storage disorders: Secondary | ICD-10-CM

## 2018-10-29 DIAGNOSIS — I82452 Acute embolism and thrombosis of left peroneal vein: Principal | ICD-10-CM | POA: Insufficient documentation

## 2018-10-29 DIAGNOSIS — Z7901 Long term (current) use of anticoagulants: Secondary | ICD-10-CM | POA: Insufficient documentation

## 2018-10-29 LAB — CBC WITH DIFFERENTIAL/PLATELET
Abs Immature Granulocytes: 0.09 10*3/uL — ABNORMAL HIGH (ref 0.00–0.07)
BASOS PCT: 0 %
Basophils Absolute: 0 10*3/uL (ref 0.0–0.1)
EOS ABS: 0.4 10*3/uL (ref 0.0–0.5)
Eosinophils Relative: 4 %
HCT: 36.2 % (ref 36.0–46.0)
Hemoglobin: 11.5 g/dL — ABNORMAL LOW (ref 12.0–15.0)
Immature Granulocytes: 1 %
Lymphocytes Relative: 37 %
Lymphs Abs: 3.4 10*3/uL (ref 0.7–4.0)
MCH: 27.1 pg (ref 26.0–34.0)
MCHC: 31.8 g/dL (ref 30.0–36.0)
MCV: 85.2 fL (ref 80.0–100.0)
Monocytes Absolute: 0.6 10*3/uL (ref 0.1–1.0)
Monocytes Relative: 6 %
NEUTROS PCT: 52 %
NRBC: 0 % (ref 0.0–0.2)
Neutro Abs: 4.7 10*3/uL (ref 1.7–7.7)
Platelets: 317 10*3/uL (ref 150–400)
RBC: 4.25 MIL/uL (ref 3.87–5.11)
RDW: 15.9 % — AB (ref 11.5–15.5)
WBC: 9.2 10*3/uL (ref 4.0–10.5)

## 2018-10-29 LAB — BASIC METABOLIC PANEL
ANION GAP: 10 (ref 5–15)
BUN: 13 mg/dL (ref 8–23)
CALCIUM: 9.3 mg/dL (ref 8.9–10.3)
CO2: 25 mmol/L (ref 22–32)
Chloride: 109 mmol/L (ref 98–111)
Creatinine, Ser: 0.92 mg/dL (ref 0.44–1.00)
GFR calc non Af Amer: >60 mL/min (ref >60)
Glucose, Bld: 85 mg/dL (ref 70–99)
POTASSIUM: 3.9 mmol/L (ref 3.5–5.1)
Sodium: 144 mmol/L (ref 135–145)

## 2018-10-29 LAB — I-STAT TROPONIN, ED: TROPONIN I, POC: 0 ng/mL (ref 0.00–0.08)

## 2018-10-29 LAB — TROPONIN I: Troponin I: <0.03 ng/mL (ref <0.03)

## 2018-10-29 LAB — BRAIN NATRIURETIC PEPTIDE: B Natriuretic Peptide: 114.1 pg/mL — ABNORMAL HIGH (ref 0.0–100.0)

## 2018-10-29 LAB — TSH: TSH: 2.125 u[IU]/mL (ref 0.350–4.500)

## 2018-10-29 MED ORDER — LEVOTHYROXINE SODIUM 50 MCG PO TABS
50.0000 ug | ORAL_TABLET | Freq: Every day | ORAL | Status: DC
  Administered 2018-10-30: 50 ug via ORAL
  Filled 2018-10-29: qty 1

## 2018-10-29 MED ORDER — IOPAMIDOL (ISOVUE-370) INJECTION 76%
INTRAVENOUS | Status: AC
  Administered 2018-10-29: 100 mL
  Filled 2018-10-29: qty 100

## 2018-10-29 MED ORDER — RIVAROXABAN 20 MG PO TABS: 20.0000 mg | ORAL_TABLET | Freq: Every day | ORAL | Status: DC

## 2018-10-29 MED ORDER — RIVAROXABAN 15 MG PO TABS
15.0000 mg | ORAL_TABLET | Freq: Two times a day (BID) | ORAL | Status: DC
  Administered 2018-10-29 – 2018-10-30 (×3): 15 mg via ORAL
  Filled 2018-10-29 (×5): qty 1

## 2018-10-29 NOTE — ED Triage Notes (Signed)
Pt arrives to ED with complaints of bilateral calf pain x4 days and worsened shortness of breath with minimal exertion (also recent). Pt states she is not on blood thinners. Pt placed in position of comfort with call bell in reach, bed locked and lowered.

## 2018-10-29 NOTE — ED Provider Notes (Addendum)
[facility] EMERGENCY DEPARTMENT Provider Note   CSN: [id number] Arrival date & time: 10/29/18  1359     History   Chief Complaint Chief Complaint  Patient presents with  . Leg Swelling  . Shortness of Breath    Isabel Chen is a 62 y.o. female.  Isabel   Patient is a 62 year old female with a history of GERD, arthritis, and hypothyroidism, iatrogenic secondary to partial thyroidectomy presenting for shortness of breath and bilateral leg swelling.  Patient reports that she has had progressive shortness of breath with exertion over the past couple months, progressing to the point where she is hardly able to walk across the room without feeling short of breath.  She reports that over the past 4 days she has had progressive increase in bilateral lower extremity edema and pain of her calves.  She reports it is worse on her left behind her knee.  Patient did take a flight from [place] 5 days ago.  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  Patient denies any chest pain, cough, hemoptysis, wheezing.  She denies any fever or chills.  Patient denies any nausea, vomiting, or diaphoresis.  Patient denies any history of DVT/PE, hormone use, cancer treatment, recent hospitalization, recent surgery or hemoptysis.  Patient denies any known history of CAD, hyperlipidemia, is currently not on medication for hypertension.  Patient is a former smoker.  Patient's mother did have an MI at age 64.  Past Medical History:  Diagnosis Date  . Arthritis   . GERD (gastroesophageal reflux disease)   . Heart murmur yrs ago  . History of bronchitis last 2016  . Pneumonia 2010  . Thyroid disease     Patient Active Problem List   Diagnosis Date Noted  . 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 COUGH 07/18/2010    Past Surgical History:  Procedure Laterality Date  . ABDOMINAL HYSTERECTOMY     partial  . APPENDECTOMY  yrs ago   with exploratory  surgery  . BUNIONECTOMY Right   . exploratory surgery  yrs ago  . HIP SURGERY Right    for bursa  . ROTATOR CUFF REPAIR Right   . THYROID LOBECTOMY Left 06/25/2016   Procedure: LEFT THYROID LOBECTOMY;  Surgeon: Darnell Level, MD;  Location: [facility];  Service: General;  Laterality: Left;     OB History   None      Home Medications    Prior to Admission medications   Medication Sig Start Date End Date Taking? Authorizing Provider  levothyroxine (SYNTHROID, LEVOTHROID) 50 MCG tablet Take 50 mcg by mouth daily. 07/31/18  Yes [provider]  naproxen sodium (ALEVE) 220 MG tablet Take 220-440 mg by mouth daily.    Yes [provider]  HYDROcodone-acetaminophen (NORCO/VICODIN) 5-325 MG tablet Take 1-2 tablets by mouth every 4 (four) hours as needed for moderate pain. Patient not taking: Reported on 10/29/2018 06/26/16   Darnell Level, MD  permethrin (ELIMITE) 5 % cream Apply to entire body, then wash off after 8-14 hours. THEN, repeat again in 1 week. Wash all clothing and bedding in hot water, treat close contacts as well. Patient not taking: Reported on 06/15/2016 07/02/15   Tomasita Crumble, MD  traMADol (ULTRAM) 50 MG tablet Take 1 tablet (50 mg total) by mouth every 6 (six) hours as needed. Patient not taking: Reported on 06/15/2016 08/18/15   Jennye Moccasin, MD    Family History Family History  Problem  Relation Age of Onset  . Diabetes Mother   . Hypertension Mother   . Stroke Father   . Cancer Maternal Grandmother        pancreatic    Social History Social History   Tobacco Use  . Smoking status: Former Smoker    Types: Cigarettes    Last attempt to quit: 11/24/1983    Years since quitting: 34.9  . Smokeless tobacco: Never Used  Substance Use Topics  . Alcohol use: Yes    Comment: glass of wine on new year's eve  . Drug use: No     Allergies   Tetracyclines & related   Review of Systems Review of Systems  Constitutional: Negative for chills and fever.    HENT: Negative for congestion and sore throat.   Eyes: Negative for visual disturbance.  Respiratory: Positive for shortness of breath. Negative for cough, chest tightness and wheezing.   Cardiovascular: Positive for leg swelling. Negative for chest pain and palpitations.  Gastrointestinal: Negative for abdominal pain, constipation, diarrhea, nausea and vomiting.  Genitourinary: Negative for dysuria and flank pain.  Musculoskeletal: Negative for back pain and myalgias.  Skin: Negative for rash.  Neurological: Negative for dizziness, syncope, light-headedness and headaches.     Physical Exam Updated Vital Signs BP 130/74   Pulse 66   Temp 98.2 F (36.8 C) (Oral)   Resp 14   Ht 5\' 10"  (1.778 m)   Wt 136.1 kg   SpO2 98%   BMI 43.05 kg/m   Physical Exam  Constitutional: She appears well-developed and well-nourished. No distress.  HENT:  Head: Normocephalic and atraumatic.  Mouth/Throat: Oropharynx is clear and moist.  Eyes: Pupils are equal, round, and reactive to light. Conjunctivae and EOM are normal.  Xanthomas present to bilateral supraorbital region.  Neck: Normal range of motion. Neck supple.  Cardiovascular: Normal rate, regular rhythm, S1 normal and S2 normal.  No murmur heard. Pulses:      Radial pulses are 2+ on the right side, and 2+ on the left side.       Popliteal pulses are 2+ on the right side, and 2+ on the left side.  Patient has symmetric, nonpitting lower extremity edema to the pretibial region.  Compartments are soft of bilateral calves.  Patient does have left popliteal tenderness.  Pulmonary/Chest: Effort normal and breath sounds normal. She has no wheezes. She has no rales.  Abdominal: Soft. She exhibits no distension. There is no tenderness. There is no guarding.  Musculoskeletal: Normal range of motion. She exhibits no edema or deformity.  Lymphadenopathy:    She has no cervical adenopathy.  Neurological: She is alert.  Cranial nerves grossly  intact. Patient moves extremities symmetrically and with good coordination.  Skin: Skin is warm and dry. No rash noted. No erythema.  Psychiatric: She has a normal mood and affect. Her behavior is normal. Judgment and thought content normal.  Nursing note and vitals reviewed.    ED Treatments / Results  Labs (all labs ordered are listed, but only abnormal results are displayed) Labs Reviewed  CBC WITH DIFFERENTIAL/PLATELET - Abnormal; Notable for the following components:      Result Value   Hemoglobin 11.5 (*)    RDW 15.9 (*)    Abs Immature Granulocytes 0.09 (*)    All other components within normal limits  BRAIN NATRIURETIC PEPTIDE - Abnormal; Notable for the following components:   B Natriuretic Peptide 114.1 (*)    All other components within normal limits  BASIC METABOLIC PANEL  TSH  I-STAT TROPONIN, ED    EKG EKG Interpretation  Date/Time:  Saturday October 29 2018 14:16:08 EST Ventricular Rate:  67 PR Interval:    QRS Duration: 91 QT Interval:  399 QTC Calculation: 422 R Axis:   63 Text Interpretation:  Sinus rhythm Low voltage, precordial leads Anteroseptal infarct, old Confirmed by Benjiman Core [phone]) on 10/29/2018 6:33:37 PM   Radiology Dg Chest 2 View  Result Date: 10/29/2018 CLINICAL DATA:  Shortness of breath for 2 months. Bilateral lower extremity pain and swelling. EXAM: CHEST - 2 VIEW COMPARISON:  06/23/2016 FINDINGS: The heart size and mediastinal contours are within normal limits. Both lungs are clear. The visualized skeletal structures are unremarkable. IMPRESSION: No active cardiopulmonary disease. Electronically Signed   By: Myles Rosenthal M.D.   On: 10/29/2018 16:38   Ct Angio Chest Pe W/cm &/or Wo Cm  Result Date: 10/29/2018 CLINICAL DATA:  Shortness of breath with exertion. EXAM: CT ANGIOGRAPHY CHEST WITH CONTRAST TECHNIQUE: Multidetector CT imaging of the chest was performed using the standard protocol during bolus administration of  intravenous contrast. Multiplanar CT image reconstructions and MIPs were obtained to evaluate the vascular anatomy. CONTRAST:  ISOVUE-370 IOPAMIDOL (ISOVUE-370) INJECTION 76% COMPARISON:  08/18/2015 FINDINGS: Cardiovascular: Heart size upper normal. No substantial pericardial effusion. No thoracic aortic aneurysm. No filling defects within the opacified pulmonary arteries to suggest acute pulmonary embolus. Mediastinum/Nodes: No mediastinal lymphadenopathy. There is no hilar lymphadenopathy. The esophagus has normal imaging features. There is no axillary lymphadenopathy. Interval left hemithyroidectomy. Lungs/Pleura: Subsegmental atelectasis noted left base. No focal airspace consolidation. No suspicious pulmonary nodule or mass. No pleural effusion. Upper Abdomen: Unremarkable. Musculoskeletal: No worrisome lytic or sclerotic osseous abnormality. Review of the MIP images confirms the above findings. IMPRESSION: 1. No CT evidence for acute pulmonary embolus. 2. No findings to explain the patient's history of shortness of breath. Electronically Signed   By: Kennith Center M.D.   On: 10/29/2018 16:26   Vas [place] Lower Extremity Venous (dvt) (only [facility])  Result Date: 10/29/2018  Lower Venous Study Indications: Pain, Swelling, and Edema.  Risk Factors: Long time driver at least 10 hours per day. Limitations: Body habitus and severe pain. Comparison Study: No comparison study available Performing Technologist: Hongying Cole  Examination Guidelines: A complete evaluation includes B-mode imaging, spectral Doppler, color Doppler, and power Doppler as needed of all accessible portions of each vessel. Bilateral testing is considered an integral part of a complete examination. Limited examinations for reoccurring indications may be performed as noted.  Right Venous Findings: +---------+---------------+---------+-----------+----------+-------+          CompressibilityPhasicitySpontaneityPropertiesSummary  +---------+---------------+---------+-----------+----------+-------+ CFV      Full           Yes      Yes                          +---------+---------------+---------+-----------+----------+-------+ SFJ      Full                                                 +---------+---------------+---------+-----------+----------+-------+ FV Prox  Full                                                 +---------+---------------+---------+-----------+----------+-------+  FV Mid   Full                                                 +---------+---------------+---------+-----------+----------+-------+ FV DistalFull                                                 +---------+---------------+---------+-----------+----------+-------+ PFV      Full                                                 +---------+---------------+---------+-----------+----------+-------+ POP      Full           Yes      Yes                          +---------+---------------+---------+-----------+----------+-------+ PTV      Full                                                 +---------+---------------+---------+-----------+----------+-------+ PERO     Full                                                 +---------+---------------+---------+-----------+----------+-------+  Left Venous Findings: +---------+---------------+---------+-----------+----------+-------+          CompressibilityPhasicitySpontaneityPropertiesSummary +---------+---------------+---------+-----------+----------+-------+ CFV      Full           Yes      Yes                          +---------+---------------+---------+-----------+----------+-------+ SFJ      Full                                                 +---------+---------------+---------+-----------+----------+-------+ FV Prox  Full                                                  +---------+---------------+---------+-----------+----------+-------+ FV Mid   Full                                                 +---------+---------------+---------+-----------+----------+-------+ FV DistalFull                                                 +---------+---------------+---------+-----------+----------+-------+  PFV      Full                                                 +---------+---------------+---------+-----------+----------+-------+ POP      Full           Yes      Yes                          +---------+---------------+---------+-----------+----------+-------+ PTV      Full                                                 +---------+---------------+---------+-----------+----------+-------+ PERO     None                                         Acute   +---------+---------------+---------+-----------+----------+-------+    Summary: Right: There is no evidence of deep vein thrombosis in the lower extremity. However, portions of this examination were limited- see technologist comments above. No cystic structure found in the popliteal fossa. Left: Findings consistent with acute deep vein thrombosis involving the left peroneal vein. No cystic structure found in the popliteal fossa.  *See table(s) above for measurements and observations.    Preliminary     Procedures Procedures (including critical care time)  Medications Ordered in ED Medications  iopamidol (ISOVUE-370) 76 % injection (100 mLs  Contrast Given 10/29/18 1551)     Initial Impression / Assessment and Plan / ED Course  I have reviewed the triage vital signs and the nursing notes.  Pertinent labs & imaging results that were available during my care of the patient were reviewed by me and considered in my medical decision making (see chart for details).  Clinical Course as of Oct 30 1847  Sat Oct 29, 2018  1727 Received communication from vascular ultrasound technician who states  that patient has an acute left peroneal DVT.   [AM]    Clinical Course User Index [AM] Elisha Ponder, PA-C    Patient nontoxic-appearing, afebrile, hemodynamically stable at rest.  No tachycardia.  Definitive diagnosis includes DVT with or without PE, ACS, unstable angina, hypothyroidism, anemia.  Patient has not had regular primary care in 2 years since the retiring of her previous primary care provider.  Patient does have xanthomas, do suspect possible underlying hyperlipidemia.   Work-up significant for slight anemia at 11.5.  No electrolyte disturbances.  BNP slightly elevated 114.  Troponin is negative.  EKG in normal sinus rhythm without evidence of acute ischemia, infarction, or arrhythmia.  CT angios demonstrating no evidence of pulmonary embolism or other acute pulmonary abnormality.  Vascular duplex ultrasound demonstrates a DVT of the peroneal vein.  At this time, I do have concerns that patient's shortness of breath could be an anginal equivalent given the profound shortness of breath she is having with short exertion.  Will discuss with hospital medicine for admission for chest pain equivalent will in addition to initiating treatment of her DVT.  Will discuss with NOAC treatment.  Spoke with Dr. Delma Officer of IM teaching service.  I appreciate her  involvement care of this patient.  Internal medicine residency service to admit.  This is a supervised visit with Dr. Benjiman CoreNathan Pickering. Evaluation, management, and discharge planning discussed with this attending physician.  Final Clinical Impressions(s) / ED Diagnoses   Final diagnoses:  Acute deep vein thrombosis (DVT) of left peroneal vein  Dyspnea on exertion  Xanthoma    ED Discharge Orders    None         Delia ChimesMurray, Kolina Kube B, PA-C 10/29/18 1929    Benjiman CorePickering, Nathan, MD 10/30/18 0004

## 2018-10-29 NOTE — Progress Notes (Signed)
Bilateral lower extremities venous duplex exam completed. Positive for acute deep vein thrombosis involving the left peroneal vein. Please see preliminary notes on CV PROC under chart review. Grizelda Piscopo H Hawa Henly(RDMS RVT) 10/29/18 5:30 PM

## 2018-10-29 NOTE — Progress Notes (Signed)
ANTICOAGULATION CONSULT NOTE - Initial Consult  Pharmacy Consult for Xarelto Indication: LLE DVT  Allergies  Allergen Reactions  . Tetracyclines & Related Hives    Patient Measurements: Height: 5\' 10"  (177.8 cm) Weight: 300 lb (136.1 kg) IBW/kg (Calculated) : 68.5  Vital Signs: Temp: 98.2 F (36.8 C) (12/07 1412) Temp Source: Oral (12/07 1412) BP: 130/74 (12/07 1530) Pulse Rate: 66 (12/07 1530)  Labs: Recent Labs    10/29/18 1418  HGB 11.5*  HCT 36.2  PLT 317  CREATININE 0.92    Estimated Creatinine Clearance: 95.6 mL/min (by C-G formula based on SCr of 0.92 mg/dL).   Medical History: Past Medical History:  Diagnosis Date  . Arthritis   . GERD (gastroesophageal reflux disease)   . Heart murmur yrs ago  . History of bronchitis last 2016  . Pneumonia 2010  . 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 Dopplers confirmed LLE DVT, CTA negative for PE. Pharmacy consulted to start Xarelto for anticoagulation.   The patient was not on blood thinners PTA, Hgb 11.5, plts wnl. SCr 0.92, CrCl~60-80 ml/min. Noted plans for the patient to be admitted to the [facility].  Goal of Therapy:  Appropriate anticoagulation for indication and hepatic/renal function    Plan:  - Start Xarelto 15 mg bid with meals for 21 days (thru 12/28) followed by 20 mg daily with supper (starting on 12/29) - Will plan to educate prior to discharge - Will monitor for bleeding and renal function for any necessary dose adjustments  Thank you for allowing pharmacy to be a part of this patient's care.  Georgina PillionElizabeth Russie Gulledge, PharmD, BCPS Clinical Pharmacist Please check [facility] for all [facility] numbers 10/29/2018 8:23 PM

## 2018-10-29 NOTE — ED Notes (Signed)
Patient transported to CT 

## 2018-10-29 NOTE — H&P (Signed)
Date: 10/29/2018               Patient Name:  Isabel Chen MRN: [id number]  DOB: [birth year] Age / Sex: 62 y.o., female   PCP: Patient, No Pcp Per         Medical Service: Internal Medicine Teaching Service         Attending Physician: Dr. Rogelia Boga, Austin Miles, MD    First Contact: Dr. Nedra Hai Pager: [phone]  Second Contact: Dr. Delma Officer Pager: [phone]       After Hours (After 5p/  First Contact Pager: [phone]  weekends / holidays): Second Contact Pager: [phone]   Chief Complaint: pain in my legs and swelling   History of Present Illness:   Isabel Chen is a 62 y.o female with iatrogenic hypothyroid s/p partial thyroid removal on synthroid, GERD, and arthritis who presented for bilateral lower extremity swelling and pain for 1 week.   Patient began to notice swelling in bilateral legs about one week prior to presentation, the swelling progressively worsened and became accompanied by swelling two days ago. She tried keeping her legs elevated and using rubbing alcohol and aspirin but this did not work to improve the pain. She has no history of blood clots in the past. 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 She denies numbness or tingling in her legs, weakness in her legs, or recent infections.  She denies any history of DVT or PE, has never had an issues with clotting. She has not had a pcp in few years.   For the 2-3 months she has also experienced dyspnea on exertion with work responsibilities that did not cause her difficulty in the past.  She reports that she even walking to the back of her truck makes her short of breath.  She also reports that she will develop shortness of breath at random times and it can wake her up from sleep.  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  The dyspnea can be associated with chest pain both of which  relieve with rest.  The chest pain is located over her entire chest, lasts for a few minutes, has no radiation, no associated diaphoresis, or nausea. She has associated PND.  She denies any  She denies fevers, chills, dysuria. She does have nocturia about twice each night and polyuria, she urinates about every hour.  Also reports polydipsia, she drinks water all the time.  She also reports a weight gain of up to 32 pounds, she does state that she has been eating more.   In the ED patient was found to be afebrile, RR 19, oxygen 99%, pulse 71, hypertensive to 154/76.  Initial troponin was negative, ekg showed possible poor r wave progression. Cxr was negative.  TSH was WNL at 2.125.  BMP was unremarkable.  CBC showed a mild anemia at 11.5, baseline of around 12.  She had a lower extremity ultrasound done that showed a DVT in the left peroneal vein.  CTA was negative for PE.  Patient was admitted to internal medicine.   Meds:  Current Meds  Medication Sig  . levothyroxine (SYNTHROID, LEVOTHROID) 50 MCG tablet Take 50 mcg by mouth daily.  . naproxen sodium (ALEVE) 220 MG tablet Take 220-440 mg by mouth daily.     Allergies: Allergies as  of 10/29/2018 - Review Complete 10/29/2018  Allergen Reaction Noted  . Tetracyclines & related Hives 02/24/2013   Past Medical History:  Diagnosis Date  . Arthritis   . GERD (gastroesophageal reflux disease)   . Heart murmur yrs ago  . History of bronchitis last 2016  . Pneumonia 2010  . Thyroid disease     Family History: Mother - MI at age 49, HTN and DM/ Father- HTN, DM. Maternal GM - pancreatic CA.   Social History: Patient is from [place], lives with her son, her husband recently passed away. She has 8 grandchildren. 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 She has a remote hx of tobacco use but quit smoking in 1985, she drinks a glass of champagne once per year.   Review of Systems: A complete ROS was negative except as per HPI.    Physical Exam: Blood pressure (!) 151/73, pulse 78, temperature 97.9 F (36.6 C), temperature source Oral, resp. rate 18, height 5\' 10"  (1.778 m), weight 136.1 kg, SpO2 100 %. Physical Exam  Constitutional: She is oriented to person, place, and time and well-developed, well-nourished, and in no distress. No distress.  HENT:  Head: Normocephalic and atraumatic.  Mouth/Throat: Oropharynx is clear and moist.  Eyes: Pupils are equal, round, and reactive to light. EOM are normal.  Neck: Normal range of motion. Neck supple. No thyromegaly present.  Cardiovascular: Normal rate, regular rhythm and normal heart sounds.  Pulmonary/Chest: Effort normal and breath sounds normal. No respiratory distress.  Abdominal: Soft. Bowel sounds are normal. She exhibits no distension. There is no tenderness.  Musculoskeletal: Normal range of motion. She exhibits edema (bilateral LE edema, 2+ to the mid shins) and tenderness (Bilateral calf tenderness L>R).  Neurological: She is alert and oriented to person, place, and time.  Skin: Skin is warm and dry. She is not diaphoretic.  Psychiatric: Mood and affect normal.    EKG: personally reviewed my interpretation is ventricular rate of 67 bpm, sinus rhythm, no acute ST elevation  CXR: personally reviewed my interpretation is increased pulmonary vasculature, borderline cardiomegaly, no active cardiopulmonary disease  CTA: IMPRESSION: 1. No CT evidence for acute pulmonary embolus. 2. No findings to explain the patient's history of shortness of breath.  Assessment & Plan by Problem: Active Problems:   Shortness of breath  This is a 62 year old female with history of hypothyroidism status post partial thyroid removal currently on Synthroid, GERD, and arthritis who presented with bilateral lower extremity swelling and pain for 1 week, chest pain, and increased dyspnea on exertion.   Left lower extremity DVT: Patient has been having bilateral lower extremity  pain, and bilateral leg swelling for the past week.  She is never had a UTI in the past.  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  She does not have any signs of infection.  On exam she does have bilateral lower extremity swelling up to the midshin, as well as calf tenderness with the left greater than the right.  Vascular duplex ultrasound showed a DVT of the left peroneal vein.  CT angiogram showed no evidence of PE or acute pulmonary abnormality.  This appears to be a provoked DVT secondary to her prolonged periods of inactivity. -Start Xarelto -Lipid panel -CBC and BMP in AM  Dyspnea on exertion, chest pain: She reports that her shortness of breath has been getting worse over the past 2 months and is worse with exertion. She does  report some chest pain with this that is atypical in nature.  She does report a weight gain and some PND.  Labs show a mildly elevated BNP 114.  Mild normocytic anemia with hemoglobin of 11.5, baseline around 12.  BMP was unremarkable.  Chest x-ray showed no acute abnormalities.  CT angios showed no evidence of PE. EKG was unremarkable. Troponins were negative x2.  Some possibilities of her worsening shortness of breath and chest pain include atypical CAD, paroxysmal atrial fibrillation, anemia, CHF, or COPD.  -Echocardiogram in the a.m. -Continue telemetry  -Trending troponins -Ambulate patient in a.m.  -She does have follow-up with her PCP next month, if tests come back negative she will likely benefit from PFTs  Normocytic anemia: Patient was noted to have a hemoglobin of 11.5.  She denied any history of bleeding, denied hemoptysis, hematochezia, melena, or bleeding from the nose or the gums.  She has not had a colonoscopy recently and does not have a PCP at the moment.  Appears to be a mild anemia however given her age and postmenopausal status she will likely benefit from outpatient colonoscopy follow-up. -Continue to monitor, daily CBC -Likely  will need colonoscopy referral on discharge  Iatrogenic hypothyroidism: She has a history of hypothyroidism status post thyroid surgery.  She is currently on levothyroxine 50 mcg daily. -Continue Synthroid 50 mcg daily  FEN: No fluids, replete lytes prn, regular diet VTE ppx: Xarelto Code Status: FULL   Dispo: Admit patient to Observation with expected length of stay less than 2 midnights.  Signed: Claudean SeveranceKrienke, Marissa M, MD 10/29/2018, 11:34 PM  Pager: [phone]

## 2018-10-30 ENCOUNTER — Other Ambulatory Visit: Payer: Self-pay | Admitting: Internal Medicine

## 2018-10-30 ENCOUNTER — Observation Stay (HOSPITAL_BASED_OUTPATIENT_CLINIC_OR_DEPARTMENT_OTHER): Payer: Self-pay

## 2018-10-30 DIAGNOSIS — R05 Cough: Secondary | ICD-10-CM

## 2018-10-30 DIAGNOSIS — R059 Cough, unspecified: Secondary | ICD-10-CM

## 2018-10-30 DIAGNOSIS — Z7901 Long term (current) use of anticoagulants: Secondary | ICD-10-CM

## 2018-10-30 DIAGNOSIS — I82452 Acute embolism and thrombosis of left peroneal vein: Secondary | ICD-10-CM

## 2018-10-30 DIAGNOSIS — E669 Obesity, unspecified: Secondary | ICD-10-CM

## 2018-10-30 DIAGNOSIS — R0609 Other forms of dyspnea: Secondary | ICD-10-CM

## 2018-10-30 DIAGNOSIS — Z6841 Body Mass Index (BMI) 40.0 and over, adult: Secondary | ICD-10-CM

## 2018-10-30 DIAGNOSIS — E785 Hyperlipidemia, unspecified: Secondary | ICD-10-CM

## 2018-10-30 DIAGNOSIS — I361 Nonrheumatic tricuspid (valve) insufficiency: Secondary | ICD-10-CM

## 2018-10-30 LAB — URINALYSIS, COMPLETE (UACMP) WITH MICROSCOPIC
Bacteria, UA: NONE SEEN
Bilirubin Urine: NEGATIVE
Glucose, UA: NEGATIVE mg/dL
Hgb urine dipstick: NEGATIVE
KETONES UR: NEGATIVE mg/dL
Leukocytes, UA: NEGATIVE
Nitrite: NEGATIVE
Protein, ur: NEGATIVE mg/dL
Specific Gravity, Urine: 1.017 (ref 1.005–1.030)
pH: 5 (ref 5.0–8.0)

## 2018-10-30 LAB — LIPID PANEL
Cholesterol: 204 mg/dL — ABNORMAL HIGH (ref 0–200)
HDL: 45 mg/dL (ref >40)
LDL Cholesterol: 139 mg/dL — ABNORMAL HIGH (ref 0–99)
Total CHOL/HDL Ratio: 4.5 RATIO
Triglycerides: 99 mg/dL (ref <150)
VLDL: 20 mg/dL (ref 0–40)

## 2018-10-30 LAB — FERRITIN: Ferritin: 35 ng/mL (ref 11–307)

## 2018-10-30 LAB — ECHOCARDIOGRAM LIMITED
Height: 70 in
WEIGHTICAEL: 5331.2 [oz_av]

## 2018-10-30 LAB — HEMOGLOBIN A1C
Hgb A1c MFr Bld: 6.3 % — ABNORMAL HIGH (ref 4.8–5.6)
Mean Plasma Glucose: 134.11 mg/dL

## 2018-10-30 LAB — HIV ANTIBODY (ROUTINE TESTING W REFLEX): HIV Screen 4th Generation wRfx: NONREACTIVE

## 2018-10-30 LAB — VITAMIN B12: Vitamin B-12: 347 pg/mL (ref 180–914)

## 2018-10-30 MED ORDER — RIVAROXABAN 20 MG PO TABS: 20.0000 mg | ORAL_TABLET | Freq: Every day | ORAL | 0 refills | Status: DC

## 2018-10-30 MED ORDER — ACETAMINOPHEN 325 MG PO TABS
650.0000 mg | ORAL_TABLET | Freq: Four times a day (QID) | ORAL | Status: DC | PRN
  Administered 2018-10-30: 650 mg via ORAL
  Filled 2018-10-30 (×2): qty 2

## 2018-10-30 MED ORDER — ALBUTEROL SULFATE HFA 108 (90 BASE) MCG/ACT IN AERS: 2.0000 | INHALATION_SPRAY | Freq: Four times a day (QID) | RESPIRATORY_TRACT | 0 refills | Status: DC | PRN

## 2018-10-30 MED ORDER — RIVAROXABAN 15 MG PO TABS: 15.0000 mg | ORAL_TABLET | Freq: Two times a day (BID) | ORAL | 0 refills | Status: DC

## 2018-10-30 MED ORDER — ALBUTEROL SULFATE HFA 108 (90 BASE) MCG/ACT IN AERS: 2.0000 | INHALATION_SPRAY | Freq: Four times a day (QID) | RESPIRATORY_TRACT | 2 refills | Status: DC | PRN

## 2018-10-30 MED ORDER — ATORVASTATIN CALCIUM 40 MG PO TABS
40.0000 mg | ORAL_TABLET | Freq: Every day | ORAL | Status: DC
  Administered 2018-10-30: 40 mg via ORAL
  Filled 2018-10-30: qty 1

## 2018-10-30 MED ORDER — ATORVASTATIN CALCIUM 40 MG PO TABS: 40.0000 mg | ORAL_TABLET | Freq: Every day | ORAL | 0 refills | Status: DC

## 2018-10-30 MED ORDER — INFLUENZA VAC SPLIT QUAD 0.5 ML IM SUSY
0.5000 mL | PREFILLED_SYRINGE | INTRAMUSCULAR | Status: AC
  Administered 2018-10-30: 0.5 mL via INTRAMUSCULAR
  Filled 2018-10-30: qty 0.5

## 2018-10-30 NOTE — Progress Notes (Signed)
Subjective:  Jones BalesMarilyn I Haden is a 62 y.o. with PMH of hypothyroidism, GERD, obesity and arthritis admit for lower extremity swelling and pain on hospital day 0  Ms.Willa RoughHicks was examined and evaluated at bedside this AM. She was observed resting comfortably in bed having finished her breakfast. She states her leg swelling pain appears to have improved. At baseline she uses compression stockings and keeps her leg elevated but noticed significant swelling with pain in the past week and came to the ED to be evaluated. She also mentions that she has significant orthopnea and dyspnea on exertion after talking for too long or walking short distances.   She was informed that she has a blood clot in her left leg after her scan and the results of the CTA were explained to her. Explained to her that we are awaiting Echo results to see if her shortness of breath can be explained. Upon questioning about her pulmonary work up history, she mentions she has had a pulmonary function test in [place] but cannot recall the results. She has never had a sleep study but she states her granddaughter does mention that she snores. Explained to her that she may need to get sleep study at some point to evaluate for OSA.  Objective:  Vital signs in last 24 hours: Vitals:   10/29/18 2020 10/29/18 2113 10/30/18 0339 10/30/18 0742  BP: (!) 150/82 (!) 151/73 130/67 (!) 98/42  Pulse: 81 78 69 (!) 59  Resp: 18 18 16 20   Temp:  97.9 F (36.6 C) 98.3 F (36.8 C) 98.4 F (36.9 C)  TempSrc:  Oral Oral Oral  SpO2: 96% 100% 100% 98%  Weight:   (!) 151.1 kg   Height:        Gen: Well-developed, well nourished, NAD, Obese HEENT: NCAT head, hearing intact, EOMI, PERRL, Uvula - Mallampati score class 2 Neck: supple, ROM intact, no JVD, no cervical adenopathy CV: RRR, S1, S2 normal, No rubs, no murmurs, no gallops Pulm: CTAB, No rales, no wheezes, no dullness to percussion  Abd: Soft, BS+, NTND, No rebound, no guarding Extm:  ROM intact, Peripheral pulses intact, 1+ pitting edema lower extremities Skin: Dry, Warm, normal turgor, no rashes, lesions, wounds.  Neuro: AAOx3, Cranial Nerve II-XII intact, DTR intact Psych: Normal mood and affect  Assessment/Plan:  Active Problems:   Dyspnea on exertion   Acute deep vein thrombosis (DVT) of left peroneal vein  Mrs.Goller is a 62 yo F w/ PMH of hypothyroidism, GERD, and arthritis admit for DVT and dyspnea. Her lower extremity swelling and pain has been diagnosed as DVT and currently she has been started on NOAC. Her dypsnea on exertion pains a clinical picture consistent with CHF or COPD. She does have a large body habitus which puts her at risk for OSA as well. She'll need further work up to determine cause of her respiratory distress before she is discharged.  Bilateral lower extremity swelling and pain 2/2 Provoked DVT Works as Naval architecttruck driver with long distances. US show DVT of left peroneal vein, right side limited exam. Pain and swelling improving. LDL-C 139 Hgb A1c 6.3 - C/w Xarelto (will need at least 3 mos) - Start atorvastatin 40mg  daily - Diabetic diet for pre-diabetes  Dyspnea on exertion 2/2 CHF vs COPD vs OSA 98 on RA but + orthopnea & significant dyspnea on exertion. BNP 114 - F/u TTE - PT has f/u with Dr.Wert in January - Keep o2sat >88  Normocytic anemia  Possibly 2/2  Ferritin 35 MCV 85 RDW 15.9 - F/u Vitamin b12  Hypothyroidism TSH 2.125 - C/w home meds: Synthroid 50 mcg  DVT prophx: Xarelto Diet: Diabetic Code: Full  Dispo: Anticipated discharge in approximately 0-1 day(s).   Theotis Barrio, MD 10/30/2018, 11:19 AM Pager: [phone]

## 2018-10-30 NOTE — Care Management Note (Signed)
Case Management Note  Patient Details  Name: Isabel Chen MRN: [id number] Date of Birth: [birth year]  Subjective/Objective:                    Action/Plan:  Spoke to patient at bedside. She was [facility] with [facility] and 30 day xaralto card. Encouraged to call clinic Monday to schedule follow up ASAP so that she can address refills for her Rxs. No other CM needs.  Expected Discharge Date:  10/30/18               Expected Discharge Plan:  Home/Self Care  In-House Referral:     Discharge planning Services  [facility]  Post Acute Care Choice:    Choice offered to:     DME Arranged:    DME Agency:     HH Arranged:    HH Agency:     Status of Service:  Completed, signed off  If discussed at [employer] Meetings, dates discussed:    Additional Comments:  Lawerance SabalDebbie Camaria Gerald, RN 10/30/2018, 3:50 PM

## 2018-10-30 NOTE — Progress Notes (Signed)
Page to MD to request med reconciliation so AVS can be re-printed.

## 2018-10-30 NOTE — Discharge Summary (Signed)
Name: Isabel Chen MRN: [id number] DOB: [birth year] 62 y.o. PCP: Patient, No Pcp Per  Date of Admission: 10/29/2018  2:05 PM Date of Discharge: 10/30/2018 Attending Physician: Blanch MediaButcher, Elizabeth, MD Discharge Diagnosis: 1. DVT of L peroneal vein  Discharge Medications: Allergies as of 10/30/2018      Reactions   Tetracyclines & Related Hives      Medication List    STOP taking these medications   ALEVE 220 MG tablet Generic drug:  naproxen sodium   HYDROcodone-acetaminophen 5-325 MG tablet Commonly known as:  NORCO/VICODIN   permethrin 5 % cream Commonly known as:  ELIMITE   traMADol 50 MG tablet Commonly known as:  ULTRAM     TAKE these medications   albuterol 108 (90 Base) MCG/ACT inhaler Commonly known as:  PROVENTIL HFA;VENTOLIN HFA Inhale 2 puffs into the lungs every 6 (six) hours as needed for wheezing or shortness of breath.   albuterol 108 (90 Base) MCG/ACT inhaler Commonly known as:  PROVENTIL HFA;VENTOLIN HFA Inhale 2 puffs into the lungs every 6 (six) hours as needed for wheezing or shortness of breath.   atorvastatin 40 MG tablet Commonly known as:  LIPITOR Take 1 tablet (40 mg total) by mouth daily at 6 PM.   levothyroxine 50 MCG tablet Commonly known as:  SYNTHROID, LEVOTHROID Take 50 mcg by mouth daily.   Rivaroxaban 15 MG Tabs tablet Commonly known as:  XARELTO Take 1 tablet (15 mg total) by mouth 2 (two) times daily with a meal.   rivaroxaban 20 MG Tabs tablet Commonly known as:  XARELTO Take 1 tablet (20 mg total) by mouth daily with supper. To be started on 12/29 Start taking on:  11/20/2018       Disposition and follow-up:   [facility].  At the hospital follow up visit please address:  1.  DVT: - US of Lower extremities revealed L peroneal vein DVT -  Started on loading dose of Xarelto -  Please ensure she continues her loading dose completely and  switch to maintenance dose - Recommend anticoagulation for at least 3 months and re-evaluate need for anticoagulation at that time.   2. Dyspnea on exertion:  - Complained of dyspnea on exertion and  - Echo showed grade 1 diastolic dysfunction but no desaturation event on ambulatory pulse ox.  - Please re-evaluate her respiratory status at follow up visit.  3. Hyperlipidemia:  - Found to have ldl of 139 - Started on atorvastatin.  - Please reassess if patient is able to tolerate the medication and/or if any adjustments to lipid-lowering therapy is indicated.  2.  Labs / imaging needed at time of follow-up: PFT, cbc, bmp  3.  Pending labs/ test needing follow-up: none  Follow-up Appointments: Follow-up Information    Nyoka CowdenWert, Michael B, MD. Call.   Specialty:  Pulmonary Disease Contact information: [address] [place] [id number] [phone]        [facility]. Call.   Why:  Monday to schedule your follow up appointment as you will need to be seen to get help with medication refills Contact information: [address] [place] [phone]          Hospital Course by problem list: 1. Provoked DVT of L peroneal vein: Presented with lower extremity swelling. Hx of long distance driving/ flights. Found to have L peroneal vein DVT. Started on Xarelto  and Atorvastatin. Discharged w/ recommendation to continue loading dose for 3 wks then switch to maintenance dose for 3 months and f/u with PCP.  2. Dyspnea on Exertion: Presented w/ complaints of shortness of breath on exertion. CTA negative for PE or pneumonia. Ambulatory pulse ox 99 on RA. TTE show 60-65% EF w/ grade 1 diastolic dysfunction. Hx of cigarette use. Discharged w/ albuterol inhaler and recommendation to f/u with pulm.  Discharge Vitals:   BP (!) 142/66 (BP Location: Right Arm)   Pulse 71   Temp 98.5 F (36.9 C) (Oral)   Resp 20   Ht 5\' 10"  (1.778 m)    Wt (!) 151.1 kg Comment: scale C with shoes  SpO2 100%   BMI 47.81 kg/m   Pertinent Labs, Studies, and Procedures:  CBC Latest Ref Rng & Units 10/29/2018 06/23/2016 08/18/2015  WBC 4.0 - 10.5 K/uL 9.2 8.8 8.3  Hemoglobin 12.0 - 15.0 g/dL 11.5(L) 12.1 12.3  Hematocrit 36.0 - 46.0 % 36.2 37.0 36.5  Platelets 150 - 400 K/uL 317 344 295   BMP Latest Ref Rng & Units 10/29/2018 08/18/2015  Glucose 70 - 99 mg/dL 85 578(I)  BUN 8 - 23 mg/dL 13 12  Creatinine 6.96 - 1.00 mg/dL 2.95 2.84  Sodium 132 - 145 mmol/L 144 143  Potassium 3.5 - 5.1 mmol/L 3.9 3.6  Chloride 98 - 111 mmol/L 109 108  CO2 22 - 32 mmol/L 25 27  Calcium 8.9 - 10.3 mg/dL 9.3 9.1   Lipid Panel     Component Value Date/Time   CHOL 204 (H) 10/30/2018 0451   TRIG 99 10/30/2018 0451   HDL 45 10/30/2018 0451   CHOLHDL 4.5 10/30/2018 0451   VLDL 20 10/30/2018 0451   LDLCALC 139 (H) 10/30/2018 0451     Lower Venous Study Summary: Right: There is no evidence of deep vein thrombosis in the lower extremity. However, portions of this examination were limited- see technologist comments above. No cystic structure found in the popliteal fossa. Left: Findings consistent with acute deep vein thrombosis involving the left peroneal vein. No cystic structure found in the popliteal fossa.  Discharge Instructions:  Isabel Chen,  You came to [place] with swollen legs and difficulty breathing. We scanned your legs and found a blood clot and have started you on a blood thinning medication. We also did a scan of your heart to explain your shortness of breath and found no problem with your heart. Here are our recommendations for you at discharge:  - START Xarelto 15 mg twice daily with food for 21 days (until 11/20/18) - Then start taking Xarelto 20mg  every day - START atorvastatin 40mg  daily - START albuterol inhaler as needed for wheezing and shortness of breath - Please follow up with your pulmonologist for your shortness of breath  Thank  you for choosing [facility]  Discharge Instructions    Call MD for:  difficulty breathing, headache or visual disturbances   Complete by:  As directed    Call MD for:  persistant dizziness or light-headedness   Complete by:  As directed    Call MD for:  persistant nausea and vomiting   Complete by:  As directed    Call MD for:  severe uncontrolled pain   Complete by:  As directed    Diet - low sodium heart healthy   Complete by:  As directed    Increase activity slowly   Complete by:  As directed       Signed: Nedra Hai,  Artist Pais, MD 10/31/2018, 5:14 PM   Pager: [phone]

## 2018-10-30 NOTE — Progress Notes (Signed)
  Echocardiogram 2D Echocardiogram has been performed.  Delcie RochENNINGTON, Docia Klar 10/30/2018, 9:45 AM

## 2018-10-30 NOTE — Progress Notes (Addendum)
PT is requesting synthroid which is not under her MAR, Internal medicine dr paged, waiting for the response  Lonia Farberekha, RN

## 2018-10-30 NOTE — Discharge Instructions (Addendum)
Isabel Chen,  You came to us with swollen legs and difficulty breathing. We scanned your legs and found a blood clot and have started you on a blood thinning medication. We also did a scan of your heart to explain your shortness of breath and found no problem with your heart. Here are our recommendations for you at discharge:  - START Xarelto 15 mg twice daily with food for 21 days (until 11/20/18) - Then start taking Xarelto 20mg  every day - START atorvastatin 40mg  daily - START albuterol inhaler as needed for wheezing and shortness of breath - Please follow up with your pulmonologist for your shortness of breath  Thank you for choosing [facility]   Information on my medicine - XARELTO (rivaroxaban)  This medication education was reviewed with me or my healthcare representative as part of my discharge preparation.  The pharmacist that spoke with me during my hospital stay was:  Toys 'R' UsKimberly Hammons, Pharm.D.  WHY WAS XARELTO PRESCRIBED FOR YOU? Xarelto was prescribed to treat blood clots that may have been found in the veins of your legs (deep vein thrombosis) or in your lungs (pulmonary embolism) and to reduce the risk of them occurring again.  What do you need to know about Xarelto? The starting dose is one 15 mg tablet taken TWICE daily with food for the FIRST 21 DAYS then on 11/20/18  the dose is changed to one 20 mg tablet taken ONCE A DAY with your evening meal.  DO NOT stop taking Xarelto without talking to the health care provider who prescribed the medication.  Refill your prescription for 20 mg tablets before you run out.  After discharge, you should have regular check-up appointments with your healthcare provider that is prescribing your Xarelto.  In the future your dose may need to be changed if your kidney function changes by a significant amount.  What do you do if you miss a dose? If you are taking Xarelto TWICE DAILY and you miss a dose, take it as soon as you remember. You  may take two 15 mg tablets (total 30 mg) at the same time then resume your regularly scheduled 15 mg twice daily the next day.  If you are taking Xarelto ONCE DAILY and you miss a dose, take it as soon as you remember on the same day then continue your regularly scheduled once daily regimen the next day. Do not take two doses of Xarelto at the same time.   Important Safety Information Xarelto is a blood thinner medicine that can cause bleeding. You should call your healthcare provider right away if you experience any of the following: ? Bleeding from an injury or your nose that does not stop. ? Unusual colored urine (red or dark brown) or unusual colored stools (red or black). ? Unusual bruising for unknown reasons. ? A serious fall or if you hit your head (even if there is no bleeding).  Some medicines may interact with Xarelto and might increase your risk of bleeding while on Xarelto. To help avoid this, consult your healthcare provider or pharmacist prior to using any new prescription or non-prescription medications, including herbals, vitamins, non-steroidal anti-inflammatory drugs (NSAIDs) and supplements.  This website has more information on Xarelto: [email or web address].xarelto.com.

## 2018-10-30 NOTE — Progress Notes (Signed)
SATURATION QUALIFICATIONS: (This note is used to comply with regulatory documentation for home oxygen)  Patient Saturations on Room Air at Rest = 100%  Patient Saturations on Room Air while Ambulating = 100%  Pt ambulated in a hallway, oxygen is 100% in room air but pt is having marked SOB and distress while walking. Will let MD know  Lonia Farberekha, RN

## 2018-10-30 NOTE — Progress Notes (Signed)
Pt got discharged to home, discharge instructions provided and patient showed understanding to it, IV taken out,Telemonitor DC,pt left unit in wheelchair with all of the belongings accompanied with a family member.  Adaora Mchaney, RN 

## 2018-11-08 ENCOUNTER — Encounter: Payer: Self-pay | Admitting: Internal Medicine

## 2018-11-08 ENCOUNTER — Ambulatory Visit (INDEPENDENT_AMBULATORY_CARE_PROVIDER_SITE_OTHER): Payer: Self-pay | Admitting: Internal Medicine

## 2018-11-08 VITALS — BP 124/78 | HR 80 | Ht 70.0 in | Wt 332.0 lb

## 2018-11-08 DIAGNOSIS — R0609 Other forms of dyspnea: Secondary | ICD-10-CM

## 2018-11-08 MED ORDER — PANTOPRAZOLE SODIUM 40 MG PO TBEC: 40.0000 mg | DELAYED_RELEASE_TABLET | Freq: Every day | ORAL | 2 refills | Status: DC

## 2018-11-08 MED ORDER — FAMOTIDINE 20 MG PO TABS: ORAL_TABLET | ORAL | 11 refills | Status: DC

## 2018-11-08 NOTE — Progress Notes (Signed)
Jones Bales, female    DOB: 09/07/1956, 62 y.o.   MRN: [id number]    Brief patient profile:  62 yobf quit smoking 1993 s resp sequelae eval in pulmonary clinic 2011 for chronic cough neg resp to prednisone with neg sinus Ct with rec 07/31/10: max rx for gerd plus 1st gen H1 blockers per guidelines  And cough resolved with cyclical cough regimen and did not recur with wt 330 at that point and did fine but then new doe attributed  To goiter > L side removed in 06/2016 was better and then worsened again indolent onset  Sept 2019 gradually so self-referred to pulmonary clinic 11/08/2018    In meantime has had CTa  10/29/18 neg for Pe in the setting of L dvt on xarelto since 10/28/18      11/08/2018  Pulmonary/ 1st office eval/Suhailah Kwan  Chief Complaint  Patient presents with  . Pulmonary Consult    Self referral.   Pt c/o SOB since September 2019. She states she gets winded walking short distances such as lobby to exam room today.  She is using her albuterol inhaler 2 x daily on average.   Dyspnea: one aisle at [employer]  Cough: none Sleep: props up 30 degrees  SABA use: as above but ? Better p anoro but more hoarse since started   No obvious day to day or daytime variability or assoc excess/ purulent sputum or mucus plugs or hemoptysis or cp or chest tightness, subjective wheeze or overt sinus or hb symptoms.   Sleeping ok as above  without nocturnal  or early am exacerbation  of respiratory  c/o's or need for noct saba. Also denies any obvious fluctuation of symptoms with weather or environmental changes or other aggravating or alleviating factors except as outlined above   No unusual exposure hx or h/o childhood pna/ asthma or knowledge of premature birth.  Current Allergies, Complete Past Medical History, Past Surgical History, Family History, and Social History were reviewed in [employer] record.  ROS  The following are not active complaints unless bolded Hoarseness,  sore throat, dysphagia, dental problems, itching, sneezing,  nasal congestion or discharge of excess mucus or purulent secretions, ear ache,   fever, chills, sweats, unintended wt loss or wt gain, classically pleuritic or exertional cp,  orthopnea pnd or arm/hand swelling  or leg swelling, presyncope, palpitations, abdominal pain, anorexia, nausea, vomiting, diarrhea  or change in bowel habits or change in bladder habits, change in stools or change in urine, dysuria, hematuria,  rash, arthralgias, visual complaints, headache, numbness, weakness or ataxia or problems with walking or coordination,  change in mood or  memory.           Past Medical History:  Diagnosis Date  . Arthritis   . GERD (gastroesophageal reflux disease)   . Heart murmur yrs ago  . History of bronchitis last 2016  . Pneumonia 2010  . Thyroid disease     Outpatient Medications Prior to Visit  Medication Sig Dispense Refill  . albuterol (PROVENTIL HFA;VENTOLIN HFA) 108 (90 Base) MCG/ACT inhaler Inhale 2 puffs into the lungs every 6 (six) hours as needed for wheezing or shortness of breath. 1 Inhaler 0  . atorvastatin (LIPITOR) 40 MG tablet Take 1 tablet (40 mg total) by mouth daily at 6 PM. 90 tablet 0  . levothyroxine (SYNTHROID, LEVOTHROID) 50 MCG tablet Take 50 mcg by mouth daily.  0  . Rivaroxaban (XARELTO) 15 MG TABS tablet Take 1 tablet (  15 mg total) by mouth 2 (two) times daily with a meal. 42 tablet 0  . umeclidinium-vilanterol (ANORO ELLIPTA) 62.5-25 MCG/INH AEPB Inhale 1 puff into the lungs daily.    Melene Muller ON 11/20/2018] rivaroxaban (XARELTO) 20 MG TABS tablet Take 1 tablet (20 mg total) by mouth daily with supper. To be started on 12/29 (Patient not taking: Reported on 11/08/2018) 90 tablet 0  .               Objective:     BP 124/78 (BP Location: Right Wrist, Cuff Size: Normal)   Pulse 80   Ht 5\' 10"  (1.778 m)   Wt (!) 332 lb (150.6 kg)   SpO2 100%   BMI 47.64 kg/m   SpO2: 100 % RA  Mod  hoarse amb bf nad/ prominent pseudowheeze on exam   HEENT: nl dentition, turbinates bilaterally, and oropharynx. Nl external ear canals without cough reflex   NECK :  without JVD/Nodes/TM/ nl carotid upstrokes bilaterally   LUNGS: no acc muscle use,  Nl contour chest which is clear to A and P bilaterally without cough on insp or exp maneuvers   CV:  RRR  no s3 or murmur or increase in P2, and no edema   ABD:  Quite obese but soft and nontender with limited  inspiratory excursion in the supine position. No bruits or organomegaly appreciated, bowel sounds nl  MS:  Nl gait/ ext warm without deformities, calf tenderness, cyanosis or clubbing No obvious joint restrictions   SKIN: warm and dry without lesions    NEURO:  alert, approp, nl sensorium with  no motor or cerebellar deficits apparent.      I personally reviewed images and agree with radiology impression as follows:   Chest CTa  10/29/18 1. No CT evidence for acute pulmonary embolus. 2. No findings to explain the patient's history of shortness of breath.  Labs  reviewed:    Chemistry      Component Value Date/Time   NA 144 10/29/2018 1418   K 3.9 10/29/2018 1418   CL 109 10/29/2018 1418   CO2 25 10/29/2018 1418   BUN 13 10/29/2018 1418   CREATININE 0.92 10/29/2018 1418      Component Value Date/Time   CALCIUM 9.3 10/29/2018 1418        Lab Results  Component Value Date   WBC 9.2 10/29/2018   HGB 11.5 (L) 10/29/2018   HCT 36.2 10/29/2018   MCV 85.2 10/29/2018   PLT 317 10/29/2018         Lab Results  Component Value Date   TSH 2.125 10/29/2018              Assessment   Dyspnea on exertion 10/30/18  Echo  Left ventricle: The cavity size was normal. Systolic function was   normal. The estimated ejection fraction was in the range of 60%   to 65%. Wall motion was normal; there were no regional wall   motion abnormalities. Doppler parameters are consistent with   abnormal left ventricular  relaxation (grade 1 diastolic   dysfunction). - Aortic valve: Transvalvular velocity was within the normal range.   There was no stenosis. There was no regurgitation. - Mitral valve: Transvalvular velocity was within the normal range.   There was no evidence for stenosis. There was trivial   regurgitation. - Right ventricle: The cavity size was normal. Wall thickness was   normal. Systolic function was normal. - Atrial septum: No defect or patent foramen  ovale was identified   by color flow Doppler. - Tricuspid valve: There was mild regurgitation. - Pulmonary arteries: Systolic pressure was mildly increased. PA    Peak pressure: 45 mm Hg (S). 11/08/2018  Walked RA  2 laps @ 246ft each @ faster than avg pace  stopped due to  End of study, min sob no desats Spirometry 11/08/2018  FEV1 2.4 (94%)  Ratio 84  p am anoro > rec d/c and rx for  GERD as assoc hoarseness and pseudowheeze on exam   Symptoms are markedly disproportionate to objective findings and not clear to what extent this is actually a pulmonary  problem but pt does appear to have difficult to sort out respiratory symptoms of unknown origin for which  DDX  = almost all start with A and  include Adherence, Ace Inhibitors, Acid Reflux, Active Sinus Disease, Alpha 1 Antitripsin deficiency, Anxiety masquerading as Airways dz,  ABPA,  Allergy(esp in young), Aspiration (esp in elderly), Adverse effects of meds,  Active smoking or Vaping, A bunch of PE's/clot burden (a few small clots can't cause this syndrome unless there is already severe underlying pulm or vascular dz with poor reserve),  Anemia or thyroid disorder, plus two Bs  = Bronchiectasis and Beta blocker use..and one C= CHF   Adherence is always the initial "prime suspect" and is a multilayered concern that requires a "trust but verify" approach in every patient - starting with knowing how to use medications, especially inhalers, correctly, keeping up with refills and understanding the  fundamental difference between maintenance and prns vs those medications only taken for a very short course and then stopped and not refilled.  - return with all meds in hand using a trust but verify approach to confirm accurate Medication  Reconciliation The principal here is that until we are certain that the  patients are doing what we've asked, it makes no sense to ask them to do more.   ? Acid (or non-acid) GERD > always difficult to exclude as up to 75% of pts in some series report no assoc GI/ Heartburn symptoms> rec max (24h)  acid suppression and diet restrictions/ reviewed and instructions given in writing.   ? Adverse effects of meds, esp dpi > try off anoro and just use saba prn for now   ? Anxiety/depression / deconditioning > usually at the bottom of this list of usual suspects and may interfere with adherence and also interpretation of response or lack thereof to symptom management which can be quite subjective.   ? Anemia/thyroid dz > no evidence of either now but at risk of both in future as now on DOAC and s/p partial thyroidectomy on replacement rx   ? A bunch of PE's   She could well have had very small pe that the CTa missed  but this is a moot point as will be on DOAC indefinitely given her MO and apparently unprovoked DVT  -  Main finding on exam is  pseudowheezing that goes along with gerd in pt who feels "like the goiter is back" but no evidence for this on exam or CTa so rx for gerd first then return for full pfts    Discussed in detail all the  indications, usual  risks and alternatives  relative to the benefits with patient who agrees to proceed with w/u as outlined.        Morbid obesity due to excess calories (HCC) complicated by spont L DVT Body mass index is 47.64  kg/m.     Lab Results  Component Value Date   TSH 2.125 10/29/2018    Contributing to gerd risk/ doe/reviewed the need and the process to achieve and maintain neg calorie balance > defer f/u  primary care including intermittently monitoring thyroid status         Total time devoted to counseling  > 50 % of initial 60 min office visit:  review case with pt/ personally observing walking saturation study/  discussion of options/alternatives/ personally creating written customized instructions  in presence of pt  then going over those specific  Instructions directly with the pt including how to use all of the meds but in particular covering each new medication in detail and the difference between the maintenance= "automatic" meds and the prns using an action plan format for the latter (If this problem/symptom => do that organization reading Left to right).  Please see AVS from this visit for a full list of these instructions which I personally wrote for this pt and  are unique to this visit.      Sandrea HughsMichael Jelitza Manninen, MD 11/08/2018

## 2018-11-08 NOTE — Patient Instructions (Addendum)
Weight control is simply a matter of calorie balance which needs to be tilted in your favor by eating less and exercising more.  To get the most out of exercise, you need to be continuously aware that you are short of breath, but never out of breath, for 30 minutes daily. As you improve, it will actually be easier for you to do the same amount of exercise  in  30 minutes so always push to the level where you are short of breath.  If this does not result in gradual weight reduction then I strongly recommend you see a nutritionist with a food diary x 2 weeks so that we can work out a negative calorie balance which is universally effective in steady weight loss programs.  Think of your calorie balance like you do your bank account where in this case you want the balance to go down so you must take in less calories than you burn up.  It's just that simple:  Hard to do, but easy to understand.  Good luck!    Please see patient coordinator before you leave today  to schedule overnight 02 sleep study    Pantoprazole (protonix) 40 mg   Take  30-60 min before first meal of the day and Pepcid (famotidine)  20 mg one p supper  until return to office - this is the best way to tell whether stomach acid is contributing to your problem.     GERD (REFLUX)  is an extremely common cause of respiratory symptoms just like yours , many times with no obvious heartburn at all.    It can be treated with medication, but also with lifestyle changes including elevation of the head of your bed (ideally with 6 inch  bed blocks),  Smoking cessation, avoidance of late meals, excessive alcohol, and avoid fatty foods, chocolate, peppermint, colas, red wine, and acidic juices such as orange juice.  NO MINT OR MENTHOL PRODUCTS SO NO COUGH DROPS   USE SUGARLESS CANDY INSTEAD (Jolley ranchers or Stover's or Life Savers) or even ice chips will also do - the key is to swallow to prevent all throat clearing. NO OIL BASED VITAMINS - use  powdered substitutes.    Please schedule a follow up office visit in 6 weeks, call sooner if needed with pfts

## 2018-11-09 ENCOUNTER — Encounter: Payer: Self-pay | Admitting: Internal Medicine

## 2018-11-09 ENCOUNTER — Ambulatory Visit (INDEPENDENT_AMBULATORY_CARE_PROVIDER_SITE_OTHER): Payer: Self-pay | Admitting: Internal Medicine

## 2018-11-09 DIAGNOSIS — I82452 Acute embolism and thrombosis of left peroneal vein: Secondary | ICD-10-CM

## 2018-11-09 DIAGNOSIS — Z7901 Long term (current) use of anticoagulants: Secondary | ICD-10-CM

## 2018-11-09 DIAGNOSIS — Z79890 Hormone replacement therapy (postmenopausal): Secondary | ICD-10-CM

## 2018-11-09 DIAGNOSIS — E039 Hypothyroidism, unspecified: Secondary | ICD-10-CM

## 2018-11-09 DIAGNOSIS — Z86718 Personal history of other venous thrombosis and embolism: Secondary | ICD-10-CM

## 2018-11-09 MED ORDER — LEVOTHYROXINE SODIUM 50 MCG PO TABS: 50.0000 ug | ORAL_TABLET | Freq: Every day | ORAL | 0 refills | Status: DC

## 2018-11-09 NOTE — Progress Notes (Signed)
   CC: Hospital follow up for left lower extremity deep vein thrombosis   HPI:  [name] is a 62 y.o. female with history noted below that presents to the acute care clinic for follow up of her left lower extremity deep vein thrombosis.  Please see problem based charting for the status of patient's chronic medical conditions.  Past Medical History:  Diagnosis Date  . Arthritis   . GERD (gastroesophageal reflux disease)   . Heart murmur yrs ago  . History of bronchitis last 2016  . Pneumonia 2010  . Thyroid disease     Review of Systems:  Review of Systems  Respiratory: Negative for shortness of breath.   Cardiovascular: Negative for chest pain and leg swelling.     Physical Exam:  Vitals:   11/09/18 0951  BP: 125/65  Pulse: 79  Temp: 97.9 F (36.6 C)  TempSrc: Oral  SpO2: 100%  Weight: (!) 333 lb 8 oz (151.3 kg)  Height: 5\' 10"  (1.778 m)   Physical Exam  Constitutional: She is well-developed, well-nourished, and in no distress.  Cardiovascular: Normal rate, regular rhythm and normal heart sounds. Exam reveals no gallop and no friction rub.  No murmur heard. Pulmonary/Chest: Effort normal and breath sounds normal. No respiratory distress. She has no wheezes. She has no rales.  Musculoskeletal:     Comments: No lower extremity edema bilaterally      Assessment & Plan:   See encounters tab for problem based medical decision making.   Patient discussed with Dr. Cleda DaubE. Graciella Arment

## 2018-11-09 NOTE — Assessment & Plan Note (Addendum)
10/30/18  Echo  Left ventricle: The cavity size was normal. Systolic function was   normal. The estimated ejection fraction was in the range of 60%   to 65%. Wall motion was normal; there were no regional wall   motion abnormalities. Doppler parameters are consistent with   abnormal left ventricular relaxation (grade 1 diastolic   dysfunction). - Aortic valve: Transvalvular velocity was within the normal range.   There was no stenosis. There was no regurgitation. - Mitral valve: Transvalvular velocity was within the normal range.   There was no evidence for stenosis. There was trivial   regurgitation. - Right ventricle: The cavity size was normal. Wall thickness was   normal. Systolic function was normal. - Atrial septum: No defect or patent foramen ovale was identified   by color flow Doppler. - Tricuspid valve: There was mild regurgitation. - Pulmonary arteries: Systolic pressure was mildly increased. PA    Peak pressure: 45 mm Hg (S). 11/08/2018  Walked RA  2 laps @ 29810ft each @ faster than avg pace  stopped due to  End of study, min sob no desats Spirometry 11/08/2018  FEV1 2.4 (94%)  Ratio 84  p am anoro > rec d/c and rx for  GERD as assoc hoarseness and pseudowheeze on exam   Symptoms are markedly disproportionate to objective findings and not clear to what extent this is actually a pulmonary  problem but pt does appear to have difficult to sort out respiratory symptoms of unknown origin for which  DDX  = almost all start with A and  include Adherence, Ace Inhibitors, Acid Reflux, Active Sinus Disease, Alpha 1 Antitripsin deficiency, Anxiety masquerading as Airways dz,  ABPA,  Allergy(esp in young), Aspiration (esp in elderly), Adverse effects of meds,  Active smoking or Vaping, A bunch of PE's/clot burden (a few small clots can't cause this syndrome unless there is already severe underlying pulm or vascular dz with poor reserve),  Anemia or thyroid disorder, plus two Bs  = Bronchiectasis  and Beta blocker use..and one C= CHF   Adherence is always the initial "prime suspect" and is a multilayered concern that requires a "trust but verify" approach in every patient - starting with knowing how to use medications, especially inhalers, correctly, keeping up with refills and understanding the fundamental difference between maintenance and prns vs those medications only taken for a very short course and then stopped and not refilled.  - return with all meds in hand using a trust but verify approach to confirm accurate Medication  Reconciliation The principal here is that until we are certain that the  patients are doing what we've asked, it makes no sense to ask them to do more.   ? Acid (or non-acid) GERD > always difficult to exclude as up to 75% of pts in some series report no assoc GI/ Heartburn symptoms> rec max (24h)  acid suppression and diet restrictions/ reviewed and instructions given in writing.   ? Adverse effects of meds, esp dpi > try off anoro and just use saba prn for now   ? Anxiety/depression / deconditioning > usually at the bottom of this list of usual suspects and may interfere with adherence and also interpretation of response or lack thereof to symptom management which can be quite subjective.   ? Anemia/thyroid dz > no evidence of either now but at risk of both in future as now on DOAC and s/p partial thyroidectomy on replacement rx   ? A bunch of PE's  She could well have had very small pe that the CTa missed  but this is a moot point as will be on DOAC indefinitely given her MO and apparently unprovoked DVT  -  Main finding on exam is  pseudowheezing that goes along with gerd in pt who feels "like the goiter is back" but no evidence for this on exam or CTa so rx for gerd first then return for full pfts    Discussed in detail all the  indications, usual  risks and alternatives  relative to the benefits with patient who agrees to proceed with w/u as outlined.

## 2018-11-09 NOTE — Patient Instructions (Signed)
Ms. Isabel Chen,  It was a pleasure meeting you today. Please follow up in our clinic in 3 months.

## 2018-11-09 NOTE — Assessment & Plan Note (Signed)
Body mass index is 47.64 kg/m.    Lab Results  Component Value Date   TSH 2.125 10/29/2018     Contributing to gerd risk/ doe/reviewed the need and the process to achieve and maintain neg calorie balance > defer f/u primary care including intermittently monitoring thyroid status      Total time devoted to counseling  > 50 % of initial 60 min office visit:  review case with pt/ discussion of options/alternatives/ personally creating written customized instructions  in presence of pt  then going over those specific  Instructions directly with the pt including how to use all of the meds but in particular covering each new medication in detail and the difference between the maintenance= "automatic" meds and the prns using an action plan format for the latter (If this problem/symptom => do that organization reading Left to right).  Please see AVS from this visit for a full list of these instructions which I personally wrote for this pt and  are unique to this visit.

## 2018-11-10 ENCOUNTER — Other Ambulatory Visit: Payer: Self-pay

## 2018-11-10 NOTE — Telephone Encounter (Signed)
rivaroxaban (XARELTO) 20 MG TABS tablet, refill request @  [id number] [place] - [address] AT [facility] [address] [phone] (Phone) [phone] (Fax)

## 2018-11-11 MED ORDER — RIVAROXABAN 20 MG PO TABS: 20.0000 mg | ORAL_TABLET | Freq: Every day | ORAL | 0 refills | Status: DC

## 2018-11-13 DIAGNOSIS — Z86718 Personal history of other venous thrombosis and embolism: Secondary | ICD-10-CM | POA: Insufficient documentation

## 2018-11-13 DIAGNOSIS — I82409 Acute embolism and thrombosis of unspecified deep veins of unspecified lower extremity: Secondary | ICD-10-CM | POA: Insufficient documentation

## 2018-11-13 DIAGNOSIS — E039 Hypothyroidism, unspecified: Secondary | ICD-10-CM | POA: Insufficient documentation

## 2018-11-13 NOTE — Assessment & Plan Note (Addendum)
HPI:  Patient states she is currently taking xarelto 15mg  twice a day.  She denies any pain or swelling in her left lower extremity since discharge from the hospital.  Assessment: History of left lower extremity DVT Hospitalized for DVT 12/7 - 12/8.  Considered provoked due to history of travel.  Patient is instructed to finish 3 month course of blood thinner and follow up with PCP to discuss anticoagulation regimen.  Plan -xarelto for 3 months - follow up in 3 months for decision to stop or continue anticoagulation

## 2018-11-13 NOTE — Assessment & Plan Note (Signed)
HPI:  Patient states she takes synthroid 50mcg.  Denies fatigue, palpitations, cold or heat intolerance  Assessment:  Hypothyroidism TSH normal 12/7.  Continue synthroid  Plan -refill synthroid

## 2018-11-15 NOTE — Progress Notes (Signed)
Internal Medicine Clinic Attending  Case discussed with Dr. Hoffman at the time of the visit.  We reviewed the resident's history and exam and pertinent patient test results.  I agree with the assessment, diagnosis, and plan of care documented in the resident's note.  

## 2018-11-22 ENCOUNTER — Other Ambulatory Visit (HOSPITAL_COMMUNITY): Payer: Self-pay | Admitting: Internal Medicine

## 2018-11-24 ENCOUNTER — Institutional Professional Consult (permissible substitution): Payer: Self-pay | Admitting: Internal Medicine

## 2018-11-24 NOTE — Telephone Encounter (Addendum)
Xarelto was refilled 12/29 per Dr Mila Homer.

## 2018-11-24 NOTE — Telephone Encounter (Signed)
Next appt scheduled 02/09/19 with PCP. 

## 2018-12-06 ENCOUNTER — Encounter: Payer: Self-pay | Admitting: Internal Medicine

## 2018-12-09 ENCOUNTER — Telehealth: Payer: Self-pay | Admitting: Internal Medicine

## 2018-12-09 NOTE — Telephone Encounter (Signed)
Called and spoke with Patient.  Dr. Sherene Sires recommendations given.  Patient stated understanding.  Nothing further at this time.

## 2018-12-09 NOTE — Telephone Encounter (Signed)
Called and spoke with patient, she stated that she went to get a refill of her Famotidine and they are no longer making this on the market. Patient is wanting something else to be called in for her to take. Patient is still taking her pantoprazole.    MW please advise, thank you.

## 2018-12-09 NOTE — Telephone Encounter (Signed)
Should be able to get it over the counter and if not then ok to take protonix Take 30- 60 min before your first and last meals of the day  Or if insurance won't cover that way then take prilosec 20 mg 30 min before supper in place of pepcid p supper

## 2018-12-21 ENCOUNTER — Ambulatory Visit: Payer: Self-pay | Admitting: Internal Medicine

## 2018-12-27 ENCOUNTER — Encounter: Payer: Self-pay | Admitting: Internal Medicine

## 2018-12-27 ENCOUNTER — Ambulatory Visit (INDEPENDENT_AMBULATORY_CARE_PROVIDER_SITE_OTHER): Payer: BLUE CROSS/BLUE SHIELD | Admitting: Internal Medicine

## 2018-12-27 ENCOUNTER — Ambulatory Visit: Payer: BLUE CROSS/BLUE SHIELD | Admitting: Internal Medicine

## 2018-12-27 VITALS — BP 126/84 | HR 80 | Ht 70.0 in | Wt 328.0 lb

## 2018-12-27 DIAGNOSIS — R0609 Other forms of dyspnea: Secondary | ICD-10-CM

## 2018-12-27 DIAGNOSIS — R05 Cough: Secondary | ICD-10-CM | POA: Diagnosis not present

## 2018-12-27 DIAGNOSIS — R058 Other specified cough: Secondary | ICD-10-CM | POA: Insufficient documentation

## 2018-12-27 LAB — PULMONARY FUNCTION TEST
DL/VA % pred: 125 %
DL/VA: 5.03 ml/min/mmHg/L
DLCO unc % pred: 89 %
DLCO unc: 21.76 ml/min/mmHg
FEF 25-75 Post: 0.88 L/sec
FEF 25-75 Pre: 2.55 L/sec
FEF2575-%Change-Post: -65 %
FEF2575-%PRED-PRE: 104 %
FEF2575-%Pred-Post: 36 %
FEV1-%Change-Post: -37 %
FEV1-%Pred-Post: 61 %
FEV1-%Pred-Pre: 97 %
FEV1-Post: 1.61 L
FEV1-Pre: 2.55 L
FEV1FVC-%CHANGE-POST: -30 %
FEV1FVC-%Pred-Pre: 100 %
FEV6-%Change-Post: -9 %
FEV6-%Pred-Post: 90 %
FEV6-%Pred-Pre: 99 %
FEV6-Post: 2.91 L
FEV6-Pre: 3.21 L
FEV6FVC-%Change-Post: 0 %
FEV6FVC-%Pred-Post: 102 %
FEV6FVC-%Pred-Pre: 103 %
FVC-%Change-Post: -9 %
FVC-%PRED-POST: 88 %
FVC-%Pred-Pre: 96 %
FVC-PRE: 3.21 L
FVC-Post: 2.92 L
PRE FEV1/FVC RATIO: 80 %
Post FEV1/FVC ratio: 55 %
Post FEV6/FVC ratio: 100 %
Pre FEV6/FVC Ratio: 100 %
RV % pred: 89 %
RV: 2.08 L
TLC % pred: 87 %
TLC: 5.19 L

## 2018-12-27 NOTE — Progress Notes (Signed)
Isabel Chen, female    DOB: 22-Nov-1956    MRN: [id number]    Brief patient profile:  [age] yobf quit smoking 1993 s resp sequelae eval in pulmonary clinic 2011 for chronic cough neg resp to prednisone with neg sinus Ct with rec 07/31/10: max rx for gerd plus 1st gen H1 blockers per guidelines  And cough resolved with cyclical cough regimen and did not recur with wt 330 at that point and did fine but then new doe attributed  To goiter > L side removed in 06/2016 was better and then worsened again indolent onset  Sept 2019 gradually so self-referred to pulmonary clinic 11/08/2018    In meantime has had CTa  10/29/18 neg for Pe in the setting of L dvt on xarelto since 10/28/18      11/08/2018  Pulmonary/ 1st office eval/Zayin Valadez  Chief Complaint  Patient presents with  . Pulmonary Consult    Self referral.   Pt c/o SOB since September 2019. She states she gets winded walking short distances such as lobby to exam room today.  She is using her albuterol inhaler 2 x daily on average.   Dyspnea: one aisle at [employer]  Cough: none Sleep: props up 30 degrees  SABA use: as above but ? Better p anoro but more hoarse since started rec Weight control is simply a matter of calorie balance  Please see patient coordinator before you leave today  to schedule overnight 02 sleep study  Pantoprazole (protonix) 40 mg   Take  30-60 min before first meal of the day and Pepcid (famotidine)  20 mg one p supper  until return to office - this is the best way to tell whether stomach acid is contributing to your problem.   GERD  Diet   Please schedule a follow up office visit in 6 weeks, call sooner if needed with pfts      12/27/2018  f/u ov/Ahmir Bracken re: sob ? All obesity/ ? vcd  - no worse off anoro  Chief Complaint  Patient presents with  . Results    PFT - Dyspnea on exertion  Dyspnea:  MMRC3 = can't walk 100 yards even at a slow pace at a flat grade s stopping due to sob   Cough: none  Sleeping: flat / 3 pillows    SABA use: no better p saba  02: none , sleep 02 sats was done but not received     No obvious day to day or daytime variability or assoc excess/ purulent sputum or mucus plugs or hemoptysis or cp or chest tightness, subjective wheeze or overt sinus or hb symptoms.   Sleeping  without nocturnal  or early am exacerbation  of respiratory  c/o's or need for noct saba. Also denies any obvious fluctuation of symptoms with weather or environmental changes or other aggravating or alleviating factors except as outlined above   No unusual exposure hx or h/o childhood pna/ asthma or knowledge of premature birth.  Current Allergies, Complete Past Medical History, Past Surgical History, Family History, and Social History were reviewed in [facility] electronic medical record.  ROS  The following are not active complaints unless bolded Hoarseness, sore throat, dysphagia, dental problems, itching, sneezing,  nasal congestion or discharge of excess mucus or purulent secretions, ear ache,   fever, chills, sweats, unintended wt loss or wt gain, classically pleuritic or exertional cp,  orthopnea pnd or arm/hand swelling  or leg swelling, presyncope, palpitations, abdominal pain, anorexia, nausea, vomiting,  diarrhea  or change in bowel habits or change in bladder habits, change in stools or change in urine, dysuria, hematuria,  rash, arthralgias, visual complaints, headache, numbness, weakness or ataxia or problems with walking or coordination,  change in mood or  memory.        Current Meds  Medication Sig  . albuterol (PROVENTIL HFA;VENTOLIN HFA) 108 (90 Base) MCG/ACT inhaler INHALE 2 PUFFS INTO THE LUNGS EVERY 6 HOURS AS NEEDED FOR WHEEZING OR SHORTNESS OF BREATH  . atorvastatin (LIPITOR) 40 MG tablet Take 1 tablet (40 mg total) by mouth daily at 6 PM.  . famotidine (PEPCID) 20 MG tablet One at bedtime  . levothyroxine (SYNTHROID, LEVOTHROID) 50 MCG tablet Take 1 tablet (50 mcg total) by mouth daily.  .  pantoprazole (PROTONIX) 40 MG tablet Take 1 tablet (40 mg total) by mouth daily. Take 30-60 min before first meal of the day  . Rivaroxaban (XARELTO) 15 MG TABS tablet Take 1 tablet (15 mg total) by mouth 2 (two) times daily with a meal.  . rivaroxaban (XARELTO) 20 MG TABS tablet Take 1 tablet (20 mg total) by mouth daily with supper. To be started on 12/29                Objective:     Wt Readings from Last 3 Encounters:  12/27/18 (!) 328 lb (148.8 kg)  11/09/18 (!) 333 lb 8 oz (151.3 kg)  11/08/18 (!) 332 lb (150.6 kg)      Vital signs reviewed - Note on arrival 02 sats  100% on RA       amb obese bf with classic pseudowheeze/ throat clearing worse p saba for pfts  HEENT: nl dentition, turbinates bilaterally, and oropharynx. Nl external ear canals without cough reflex   NECK :  without JVD/Nodes/TM/ nl carotid upstrokes bilaterally   LUNGS: no acc muscle use,  Nl contour chest which is clear to A and P bilaterally without cough on insp or exp maneuvers   CV:  RRR  no s3 or murmur or increase in P2, and no edema   ABD:  Quite obese soft and nontender with nl inspiratory excursion in the supine position. No bruits or organomegaly appreciated, bowel sounds nl  MS:  Nl gait/ ext warm without deformities, calf tenderness, cyanosis or clubbing No obvious joint restrictions   SKIN: warm and dry without lesions    NEURO:  alert, approp, nl sensorium with  no motor or cerebellar deficits apparent.       I personally reviewed images and agree with radiology impression as follows:   Chest CTa  10/29/18 1. No CT evidence for acute pulmonary embolus. 2. No findings to explain the patient's history of shortness of breath.  Labs  reviewed:    Chemistry      Component Value Date/Time   NA 144 10/29/2018 1418   K 3.9 10/29/2018 1418   CL 109 10/29/2018 1418   CO2 25 10/29/2018 1418   BUN 13 10/29/2018 1418   CREATININE 0.92 10/29/2018 1418      Component Value Date/Time    CALCIUM 9.3 10/29/2018 1418        Lab Results  Component Value Date   WBC 9.2 10/29/2018   HGB 11.5 (L) 10/29/2018   HCT 36.2 10/29/2018   MCV 85.2 10/29/2018   PLT 317 10/29/2018         Lab Results  Component Value Date   TSH 2.125 10/29/2018  Assessment

## 2018-12-27 NOTE — Patient Instructions (Addendum)
Weight control is simply a matter of calorie balance which needs to be tilted in your favor by eating less and exercising more.  To get the most out of exercise, you need to be continuously aware that you are short of breath, but never out of breath, for 30 minutes daily. As you improve, it will actually be easier for you to do the same amount of exercise  in  30 minutes so always push to the level where you are short of breath.  If this does not result in gradual weight reduction then I strongly recommend you see a nutritionist with a food diary x 2 weeks so that we can work out a negative calorie balance which is universally effective in steady weight loss programs.  Think of your calorie balance like you do your bank account where in this case you want the balance to go down so you must take in less calories than you burn up.  It's just that simple:  Hard to do, but easy to understand.  Good luck!    Please see patient coordinator before you leave today  to schedule ENT by DR Delford Field at [facility]    Pulmonary follow up is as needed

## 2018-12-27 NOTE — Progress Notes (Signed)
PFT completed today.  

## 2018-12-28 ENCOUNTER — Encounter: Payer: Self-pay | Admitting: Internal Medicine

## 2018-12-28 NOTE — Assessment & Plan Note (Addendum)
10/30/18  Echo  Left ventricle: The cavity size was normal. Systolic function was   normal. The estimated ejection fraction was in the range of 60%   to 65%. Wall motion was normal; there were no regional wall   motion abnormalities. Doppler parameters are consistent with   abnormal left ventricular relaxation (grade 1 diastolic   dysfunction). - Aortic valve: Transvalvular velocity was within the normal range.   There was no stenosis. There was no regurgitation. - Mitral valve: Transvalvular velocity was within the normal range.   There was no evidence for stenosis. There was trivial   regurgitation. - Right ventricle: The cavity size was normal. Wall thickness was   normal. Systolic function was normal. - Atrial septum: No defect or patent foramen ovale was identified   by color flow Doppler. - Tricuspid valve: There was mild regurgitation. - Pulmonary arteries: Systolic pressure was mildly increased. PA    Peak pressure: 45 mm Hg (S). 11/08/2018  Walked RA  2 laps @ 213ft each @ faster than avg pace  stopped due to  End of study, min sob no desats Spirometry 11/08/2018  FEV1 2.4 (94%)  Ratio 84  p am anoro > rec d/c and rx for  GERD as assoc hoarseness and pseudowheeze on exam  - PFT's  12/27/2018  FEV1 2.55 (97 % ) ratio 0.80  p no % improvement from saba p nothing prior to study with DLCO  89 % corrects to 125  % for alv volume  With ERV 44% c/w body habitus   Main findings support obesity/ vcd driving her symptoms though concerned re noct hypoxemia possibly contributing to [facility] so needs to complete the ono on RA eval and work on wt but no need for pulmonary rx otherwise at this point

## 2018-12-28 NOTE — Assessment & Plan Note (Signed)
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

## 2018-12-28 NOTE — Assessment & Plan Note (Signed)
ERV 44% c/w body habitus  Body mass index is 47.06 kg/m.  -  trending down slightly Lab Results  Component Value Date   TSH 2.125 10/29/2018     Contributing to gerd risk/ doe/reviewed the need and the process to achieve and maintain neg calorie balance > defer f/u primary care including intermittently monitoring thyroid status       I had an extended discussion with the patient reviewing all relevant studies completed to date and  lasting 15 to 20 minutes of a 25 minute visit    Each maintenance medication was reviewed in detail including most importantly the difference between maintenance and prns and under what circumstances the prns are to be triggered using an action plan format that is not reflected in the computer generated alphabetically organized AVS.     Please see AVS for specific instructions unique to this visit that I personally wrote and verbalized to the the pt in detail and then reviewed with pt  by my nurse highlighting any  changes in therapy recommended at today's visit to their plan of care.

## 2018-12-29 ENCOUNTER — Encounter: Payer: Self-pay | Admitting: Internal Medicine

## 2018-12-29 DIAGNOSIS — J9611 Chronic respiratory failure with hypoxia: Secondary | ICD-10-CM | POA: Insufficient documentation

## 2018-12-30 ENCOUNTER — Telehealth: Payer: Self-pay | Admitting: Internal Medicine

## 2018-12-30 DIAGNOSIS — J9611 Chronic respiratory failure with hypoxia: Secondary | ICD-10-CM

## 2018-12-30 NOTE — Telephone Encounter (Signed)
ONO on RA done by [facility] on 11/27/2018  Per MW needs o2 2lpm and repeat ONO on 2lpm  Spoke with pt and notified of results per Dr. Sherene Sires. Pt verbalized understanding and denied any questions.  Orders sent to [facility]

## 2019-01-02 ENCOUNTER — Telehealth: Payer: Self-pay | Admitting: Internal Medicine

## 2019-01-02 DIAGNOSIS — J9611 Chronic respiratory failure with hypoxia: Secondary | ICD-10-CM

## 2019-01-02 NOTE — Telephone Encounter (Signed)
Noted  

## 2019-01-02 NOTE — Telephone Encounter (Signed)
Called and left [facility] a message.  Per the message the O2 order was placed after the 30 day limit for the test so this test is now invalid and will need to be repeated on room air.  Sending to MW to advise.   Left message for the patient to advise.

## 2019-01-02 NOTE — Telephone Encounter (Signed)
Order for ONO on room air has been placed per MW. Nothing further is needed at this time.

## 2019-01-02 NOTE — Telephone Encounter (Signed)
Fine to do ono RA initially

## 2019-01-17 ENCOUNTER — Encounter: Payer: Self-pay | Admitting: Internal Medicine

## 2019-02-09 ENCOUNTER — Encounter: Payer: Self-pay | Admitting: Internal Medicine

## 2019-03-12 ENCOUNTER — Other Ambulatory Visit: Payer: Self-pay | Admitting: Internal Medicine

## 2019-04-27 ENCOUNTER — Encounter: Payer: Self-pay | Admitting: Internal Medicine

## 2019-04-28 ENCOUNTER — Encounter: Payer: Self-pay | Admitting: Internal Medicine

## 2019-04-28 NOTE — Telephone Encounter (Signed)
I called and talked to Isabel Chen, after received her message today, about having progressive bruise on her arm.  She mentions that it  Started yesterday and got worse today. (Images from patient are attached).She denies any recent trauma to her arm.  Denies any pain on the area.  She endorses that the sensation and muscle strength are intact.  And she is able to move her fingers and arm well without any problem. 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 Her last visit at [facility] was on January, when she was recommended to continue anticoagulation at least for 3 months and then be seen in clinic to decide about duration of treatment. 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 Currently in [place], but hope to fly back in few days          Assessment and Plan:: Expanding bruise in setting of anticoagulation.  She has been on Xarelto for about 6 months now, considering her ongoing risk factor for DVT, (being a [employer] driver), she may need longer treatment with anticoagulation, how ever, due to expanding bruise, I recommend holding Xarelto for now but with all precautions for preventing DVT. (She agrees to give a break every hour or less, while driving and do enough leg exercise). She is hoping to come back to [place] in few days (with flight) and be seen in clinic to be reassessed and decide about long term plan.  All of her questions and concerns were addressed.  She verbalizes understanding and agrees with plan and is willing to go to urgent care.  Also asked her to share [place] the results.  -Hold Xarelto (last dose was yesterday) and go to urgent care (she is out of [place]) as soon as possible today -Recommended to use intermittent ice pack meanwhile. -Return in clinic when back to [place] as soon as possible to reevaluate her and to decide about long term plan -Will follow with patient

## 2019-04-28 NOTE — Telephone Encounter (Signed)
Agree with assessment and plan 

## 2019-05-13 ENCOUNTER — Other Ambulatory Visit: Payer: Self-pay | Admitting: Internal Medicine

## 2019-06-05 ENCOUNTER — Other Ambulatory Visit: Payer: Self-pay | Admitting: *Deleted

## 2019-06-05 MED ORDER — ATORVASTATIN CALCIUM 40 MG PO TABS: ORAL_TABLET | ORAL | 0 refills | Status: DC

## 2019-06-05 NOTE — Telephone Encounter (Signed)
Received refill request from pt's pharmacy for atorvastatin.  Pt last office visit 11/09/2018-call made to patient to schedule appt with pcp.  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  Pt was given an appt for Sept 3rd with her pcp-pt agreed to appt and will be seen at that time.  Will forward refill request to pcp for review.Isabel Chen, [name]7/13/202011:25 AM

## 2019-06-07 ENCOUNTER — Encounter: Payer: Self-pay | Admitting: *Deleted

## 2019-07-06 ENCOUNTER — Encounter: Payer: BLUE CROSS/BLUE SHIELD | Admitting: Internal Medicine

## 2019-07-27 ENCOUNTER — Encounter: Payer: BLUE CROSS/BLUE SHIELD | Admitting: Internal Medicine

## 2019-07-30 ENCOUNTER — Other Ambulatory Visit: Payer: Self-pay | Admitting: Internal Medicine

## 2019-08-01 NOTE — Telephone Encounter (Signed)
Refill appropriate but recommending to be seen in clinic as well.

## 2019-08-03 ENCOUNTER — Encounter: Payer: Self-pay | Admitting: Internal Medicine

## 2019-08-03 NOTE — Telephone Encounter (Signed)
Dr Myrtie Hawk,  Can you please sign refill request and I will send appt request to front office.Regenia Skeeter, Lanaya Bennis Cassady9/10/202011:46 AM

## 2019-08-04 ENCOUNTER — Encounter: Payer: Self-pay | Admitting: Internal Medicine

## 2019-08-04 ENCOUNTER — Ambulatory Visit (INDEPENDENT_AMBULATORY_CARE_PROVIDER_SITE_OTHER): Payer: Self-pay | Admitting: Internal Medicine

## 2019-08-04 ENCOUNTER — Other Ambulatory Visit: Payer: Self-pay

## 2019-08-04 VITALS — BP 162/69 | HR 79 | Temp 98.4°F | Wt 333.0 lb

## 2019-08-04 DIAGNOSIS — E039 Hypothyroidism, unspecified: Secondary | ICD-10-CM

## 2019-08-04 DIAGNOSIS — Z23 Encounter for immunization: Secondary | ICD-10-CM

## 2019-08-04 DIAGNOSIS — Z7901 Long term (current) use of anticoagulants: Secondary | ICD-10-CM

## 2019-08-04 DIAGNOSIS — Z86718 Personal history of other venous thrombosis and embolism: Secondary | ICD-10-CM

## 2019-08-04 DIAGNOSIS — R05 Cough: Secondary | ICD-10-CM

## 2019-08-04 DIAGNOSIS — I10 Essential (primary) hypertension: Secondary | ICD-10-CM

## 2019-08-04 DIAGNOSIS — Z79899 Other long term (current) drug therapy: Secondary | ICD-10-CM

## 2019-08-04 DIAGNOSIS — I82452 Acute embolism and thrombosis of left peroneal vein: Secondary | ICD-10-CM

## 2019-08-04 MED ORDER — AMLODIPINE BESYLATE 5 MG PO TABS: 5.0000 mg | ORAL_TABLET | Freq: Every day | ORAL | 1 refills | Status: DC

## 2019-08-04 MED ORDER — RIVAROXABAN 20 MG PO TABS: ORAL_TABLET | ORAL | 0 refills | Status: DC

## 2019-08-04 NOTE — Progress Notes (Signed)
Internal Medicine Clinic Attending  Case discussed with Dr. Chundi at the time of the visit.  We reviewed the resident's history and exam and pertinent patient test results.  I agree with the assessment, diagnosis, and plan of care documented in the resident's note. 

## 2019-08-04 NOTE — Progress Notes (Signed)
   CC: Blood pressure follow up  HPI:  [name] is a 63 y.o. with hypothyroidism, upper airway cough syndrome, hx of dvt who presents for blood pressure follow up. Please see problem based charting for evaluation, assessment, and plan.  Past Medical History:  Diagnosis Date  . Arthritis   . GERD (gastroesophageal reflux disease)   . Heart murmur yrs ago  . History of bronchitis last 2016  . Pneumonia 2010  . Thyroid disease    Review of Systems:    Review of Systems  Constitutional: Negative for chills and fever.  Respiratory: Negative for cough and shortness of breath.   Cardiovascular: Negative for chest pain.  Gastrointestinal: Negative for abdominal pain, nausea and vomiting.  Musculoskeletal: Positive for myalgias.  Neurological: Negative for dizziness and headaches.   Physical Exam:  Vitals:   08/04/19 0928  BP: (!) 162/69  Pulse: 79  Temp: 98.4 F (36.9 C)  TempSrc: Oral  SpO2: 99%  Weight: (!) 333 lb (151 kg)   Physical Exam  Constitutional: Appears well-developed and well-nourished. No distress.  HENT:  Head: Normocephalic and atraumatic.  Eyes: Conjunctivae are normal.  Cardiovascular: Normal rate, regular rhythm and normal heart sounds.  Respiratory: Effort normal and breath sounds normal. No respiratory distress. No wheezes.  GI: Soft. Bowel sounds are normal. No distension. There is no tenderness.  Musculoskeletal: No edema.  Neurological: Is alert.  Skin: Not diaphoretic. No erythema.  Psychiatric: Normal mood and affect. Behavior is normal. Judgment and thought content normal.   Assessment & Plan:   See Encounters Tab for problem based charting.  Patient discussed with Dr. Angelia Mould

## 2019-08-04 NOTE — Assessment & Plan Note (Signed)
Patient has not followed up with pcp to discuss whether she needs xarelto. The DVT for which she was hosptialized 12/7-12/8 was considered provoked (due to her history of long distance travel). She was given xarelto for 3 months and told to follow up to determine if she needs continued anticoagulation.   -Refilled xarelto till patient meets with pcp on 10/8. 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

## 2019-08-04 NOTE — Patient Instructions (Signed)
It was a pleasure to see you today Ms. Bomberger. Please make the following changes:  You have been diagnosed with hypertension during this visit -please start taking amlodipine 5mg  daily  -please follow with our nutritionist -please exercise 5min daily  -please eat healthy -please follow up with Dr. Melvyn Novas regarding sleep study  If you have any questions or concerns, please call our clinic at [phone] between 9am-5pm and after hours call [phone] and ask for the internal medicine resident on call. If you feel you are having a medical emergency please call 911.   Thank you, we look forward to help you remain healthy!  Lars Mage, MD Internal Medicine PGY3

## 2019-08-04 NOTE — Assessment & Plan Note (Signed)
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 Patient has not had a diagnosis of htn prior. She states that both her parents had htn. Stable weight.  The patient's blood pressure during this visit was 162/69, 79. Her last blood pressure visits are   BP Readings from Last 3 Encounters:  08/04/19 (!) 162/69  12/27/18 126/84  11/09/18 125/65   The patient does not report palpitations, dizziness, chest pain, sob.  Assessment and Plan Will start first line agent for african american individuals CCB.   -start amlodipine 5mg  qd -bmp -Dr. Melvyn Novas is evaluating patient for OSA (secondary htn) -patient does not smoke -Referral to nutritionist -Follow up in 2 weeks to determine whether patient needs additional antihypertensives

## 2019-08-05 ENCOUNTER — Other Ambulatory Visit: Payer: Self-pay | Admitting: Internal Medicine

## 2019-08-05 LAB — BMP8+ANION GAP
Anion Gap: 14 mmol/L (ref 10.0–18.0)
BUN/Creatinine Ratio: 12 (ref 12–28)
BUN: 12 mg/dL (ref 8–27)
CO2: 24 mmol/L (ref 20–29)
Calcium: 9.5 mg/dL (ref 8.7–10.3)
Chloride: 104 mmol/L (ref 96–106)
Creatinine, Ser: 0.98 mg/dL (ref 0.57–1.00)
GFR calc Af Amer: 71 mL/min/{1.73_m2} (ref >59)
GFR calc non Af Amer: 62 mL/min/{1.73_m2} (ref >59)
Glucose: 152 mg/dL — ABNORMAL HIGH (ref 65–99)
Potassium: 4.2 mmol/L (ref 3.5–5.2)
Sodium: 142 mmol/L (ref 134–144)

## 2019-08-05 MED ORDER — RIVAROXABAN 20 MG PO TABS: ORAL_TABLET | ORAL | 0 refills | Status: DC

## 2019-08-05 NOTE — Progress Notes (Signed)
Sent refill for Xarelto. Patient will follow up in clinic.

## 2019-08-07 ENCOUNTER — Other Ambulatory Visit: Payer: Self-pay | Admitting: Internal Medicine

## 2019-08-07 ENCOUNTER — Encounter: Payer: BLUE CROSS/BLUE SHIELD | Admitting: Dietician

## 2019-08-07 DIAGNOSIS — I82452 Acute embolism and thrombosis of left peroneal vein: Secondary | ICD-10-CM

## 2019-08-07 MED ORDER — RIVAROXABAN 20 MG PO TABS: ORAL_TABLET | ORAL | 0 refills | Status: DC

## 2019-08-07 NOTE — Progress Notes (Signed)
Sent refill for Xarelto.  Patient is scheduled to be seen in [facility].

## 2019-08-08 ENCOUNTER — Telehealth: Payer: Self-pay | Admitting: Internal Medicine

## 2019-08-08 DIAGNOSIS — J9611 Chronic respiratory failure with hypoxia: Secondary | ICD-10-CM

## 2019-08-08 NOTE — Telephone Encounter (Signed)
ONO on RA done by [facility] on 08/03/19 received and reviewed by MW  Per MW- "was supposed to be done on 2lpm, if so, do on 3lpm if not repeat on 2lpm" I called and spoke with the pt and confirmed that the test was done on RA  She agreed to have it done again but this time on 2lpm  ONO reordered

## 2019-08-09 ENCOUNTER — Telehealth: Payer: Self-pay | Admitting: Internal Medicine

## 2019-08-09 DIAGNOSIS — R0609 Other forms of dyspnea: Secondary | ICD-10-CM

## 2019-08-09 DIAGNOSIS — J9611 Chronic respiratory failure with hypoxia: Secondary | ICD-10-CM

## 2019-08-09 NOTE — Telephone Encounter (Signed)
Here was the plan   ono 11/27/2018 RA x 1 h 62min > 12/29/2018  rec repeat on 2lpm > done 08/03/19 "RA" desat < 89% x 49min so on 08/08/2019  Rec repeat on 2lpm or one more liter than prior setting to be sure we have eliminated the desats on whatever she ends up on

## 2019-08-09 NOTE — Telephone Encounter (Signed)
The new ONO has already been ordered, it was ordered on 9/15. Are you ok with [place] ordering the 2L of O2 at night?

## 2019-08-09 NOTE — Telephone Encounter (Signed)
Dr. Melvyn Novas, based on there response below from [facility]) are will willing to sign off on the new order for O2?  Patient last seen by you on 12/27/18. There is an encounter dated 08/08/19 regarding the ONO results.  Please review and advise. Thank you.

## 2019-08-09 NOTE — Telephone Encounter (Signed)
Patient has never had her 02 at night setup. Per Levada Dy with [employer] they can use the ONO from 08/03/19 but will need a new 02 order for which was placed in 12/2018. Then they can do the new ONO on 2 liters  Route (nasal cannula OR mask):  Liter Flow: 2 Frequency (continuous with stationary and portable oxygen unit needed OR only at night): Nocturnal Was this based off an ONO?: Yes DME: [facility] Oxygen Conserving Device (Yes or No): yes Type of Oxygen: Gas

## 2019-08-10 ENCOUNTER — Telehealth: Payer: Self-pay | Admitting: Internal Medicine

## 2019-08-10 NOTE — Telephone Encounter (Signed)
Original order was for 2lpm and repeat 02 on 2lpm   This order was never carried out correctly so yes we need to order 2lpm hs and repeat ONO on 2lpm

## 2019-08-10 NOTE — Telephone Encounter (Signed)
Orders placed for ONO on 2L as well DME order for 2L at night. Nothing further needed at this time.   Will route Rodena Piety as FYI.

## 2019-08-10 NOTE — Telephone Encounter (Signed)
LMTCB for pt. Pt needs an OV with MW within 30 days of the ONO  - Needs OV prior to 09/02/19 with MW.

## 2019-08-11 NOTE — Telephone Encounter (Signed)
Patient has been scheduled with MW on 9/24. Will close this encounter.

## 2019-08-17 ENCOUNTER — Other Ambulatory Visit: Payer: Self-pay

## 2019-08-17 ENCOUNTER — Ambulatory Visit (INDEPENDENT_AMBULATORY_CARE_PROVIDER_SITE_OTHER): Payer: Self-pay | Admitting: Internal Medicine

## 2019-08-17 ENCOUNTER — Encounter: Payer: Self-pay | Admitting: Internal Medicine

## 2019-08-17 DIAGNOSIS — J9611 Chronic respiratory failure with hypoxia: Secondary | ICD-10-CM

## 2019-08-17 DIAGNOSIS — R058 Other specified cough: Secondary | ICD-10-CM

## 2019-08-17 DIAGNOSIS — R05 Cough: Secondary | ICD-10-CM

## 2019-08-17 DIAGNOSIS — R0609 Other forms of dyspnea: Secondary | ICD-10-CM

## 2019-08-17 NOTE — Assessment & Plan Note (Addendum)
ONO  11/27/2018 RA x 1 h 85min > 12/29/2018  rec repeat on 2lpm > done 08/03/19 "RA" desat < 89% x 58min >> 08/08/2019  Rec repeat ONO on  2lpm    Most likely related to obesity and poor v/q when sleeping in the dep segments > rec wt loss and wear 02 for now hs only   If any problem at all with hypersomnolence > very low threshold to refer to sleep medicine

## 2019-08-17 NOTE — Assessment & Plan Note (Signed)
ERV 44% c/w body habitus  Body mass index is 51.78 kg/m.  -  trending up  Lab Results  Component Value Date   TSH 2.125 10/29/2018     Contributing to gerd risk/ doe/reviewed the need and the process to achieve and maintain neg calorie balance > defer f/u primary care including intermittently monitoring thyroid status    Each maintenance medication was reviewed in detail including most importantly the difference between maintenance and as needed and under what circumstances the prns are to be used.  Please see AVS for specific  Instructions which are unique to this visit and I personally typed out  which were reviewed in detail in writing with the patient and a copy provided.

## 2019-08-17 NOTE — Assessment & Plan Note (Signed)
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

## 2019-08-17 NOTE — Progress Notes (Signed)
Isabel Chen, female    DOB: December 31, 1955    MRN: [id number]    Brief patient profile:  11 yobf quit smoking 1993 s resp sequelae eval in pulmonary clinic 2011 for chronic cough neg resp to prednisone with neg sinus Ct with rec 07/31/10: max rx for gerd plus 1st gen H1 blockers per guidelines  And cough resolved with cyclical cough regimen and did not recur with wt 330 at that point and did fine but then new doe attributed  To goiter > L thyroid lobectomy 06/2016 was better and then worsened again indolent onset  Sept 2019 gradually so self-referred to pulmonary clinic 11/08/2018    In meantime has had CTa  10/29/18 neg for Pe in the setting of 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 Pulmonary Consult    Self referral.   Pt c/o SOB since September 2019. She states she gets winded walking short distances such as lobby to exam room today.  She is using her albuterol inhaler 2 x daily on average.   Dyspnea: one aisle at [employer]  Cough: none Sleep: props up 30 degrees  SABA use: as above but ? Better p anoro but more hoarse since started rec Weight control is simply a matter of calorie balance  Please see patient coordinator before you leave today  to schedule overnight 02 sleep study  Pantoprazole (protonix) 40 mg   Take  30-60 min before first meal of the day and Pepcid (famotidine)  20 mg one p supper  until return to office - this is the best way to tell whether stomach acid is contributing to your problem.   GERD  Diet   Please schedule a follow up office visit in 6 weeks, call sooner if needed with pfts      12/27/2018  f/u ov/Almon Whitford re: sob ? All obesity/ ? vcd  - no worse off anoro  Chief Complaint  Patient presents with  . 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 Follow-up    Here to meet requirement for nocturnal o2.    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    No obvious day to day or daytime variability or assoc excess/ purulent sputum or mucus plugs or hemoptysis or cp or chest tightness, subjective wheeze or overt sinus or hb symptoms.  Sleeping  without nocturnal  or early am exacerbation  of respiratory  c/o's or need for noct saba. Also denies any obvious fluctuation of symptoms with weather or environmental changes or other aggravating or alleviating factors except as outlined above   No unusual exposure hx or h/o childhood pna/ asthma or knowledge of premature birth.  Current Allergies, Complete Past Medical History, Past Surgical History, Family History, and Social History were reviewed in [employer] record.  ROS  The following are not active complaints unless bolded Hoarseness, sore throat, dysphagia, dental problems, itching, sneezing,  nasal congestion or discharge of excess mucus or purulent secretions, ear ache,   fever, chills, sweats, unintended wt loss or wt gain, classically pleuritic or exertional cp,  orthopnea pnd or arm/hand swelling  or  leg swelling, presyncope, palpitations, abdominal pain, anorexia, nausea, vomiting, diarrhea  or change in bowel habits or change in bladder habits, change in stools or change in urine, dysuria, hematuria,  rash, arthralgias, visual complaints, headache, numbness, weakness or ataxia or problems with walking or coordination,  change in mood or  memory.        Current Meds  Medication Sig  . albuterol (PROVENTIL HFA;VENTOLIN HFA) 108 (90 Base) MCG/ACT inhaler INHALE 2 PUFFS INTO THE LUNGS EVERY 6 HOURS AS NEEDED FOR WHEEZING OR SHORTNESS OF BREATH  . amLODipine (NORVASC) 10 MG tablet Take 1 tablet (10 mg total) by mouth daily.  Marland Kitchen atorvastatin (LIPITOR) 40 MG tablet TAKE 1 TABLET BY MOUTH DAILY AT 6 PM  . famotidine (PEPCID) 20 MG tablet One at bedtime  . levothyroxine (SYNTHROID) 50 MCG tablet TAKE 1 TABLET BY MOUTH EVERY DAY  . losartan (COZAAR) 25 MG tablet Take 1 tablet (25 mg total) by mouth daily.  . pantoprazole (PROTONIX) 40 MG tablet Take 1 tablet (40 mg total) by mouth daily. Take 30-60 min before first meal of the day  . rivaroxaban (XARELTO) 20 MG TABS tablet TAKE 1 TABLET BY MOUTH DAILY WITH SUPPER               Objective:     08/17/2019            333   12/27/18 (!) 328 lb (148.8 kg)  11/09/18 (!) 333 lb 8 oz (151.3 kg)  11/08/18 (!) 332 lb (150.6 kg)      Vital signs reviewed - Note on arrival 02 sats  100% on RA      Obese hoarse pleasant bf nad   HEENT : pt wearing mask not removed for exam due to covid -19 concerns.    NECK :  without JVD/Nodes/TM/ nl carotid upstrokes bilaterally   LUNGS: no acc muscle use,  Nl contour chest which is clear to A and P bilaterally without cough on insp or exp maneuvers   CV:  RRR  no s3 or murmur or increase in P2, and no edema   ABD:  Obese soft and nontender with nl inspiratory excursion in the supine position. No bruits or organomegaly appreciated, bowel sounds nl  MS:  Nl gait/ ext warm without deformities, calf  tenderness, cyanosis or clubbing No obvious joint restrictions   SKIN: warm and dry without lesions    NEURO:  alert, approp, nl sensorium with  no motor or cerebellar deficits apparent.            Assessment

## 2019-08-17 NOTE — Assessment & Plan Note (Signed)
10/30/18  Echo  Left ventricle: The cavity size was normal. Systolic function was   normal. The estimated ejection fraction was in the range of 60%   to 65%. Wall motion was normal; there were no regional wall   motion abnormalities. Doppler parameters are consistent with   abnormal left ventricular relaxation (grade 1 diastolic   dysfunction). - Aortic valve: Transvalvular velocity was within the normal range.   There was no stenosis. There was no regurgitation. - Mitral valve: Transvalvular velocity was within the normal range.   There was no evidence for stenosis. There was trivial   regurgitation. - Right ventricle: The cavity size was normal. Wall thickness was   normal. Systolic function was normal. - Atrial septum: No defect or patent foramen ovale was identified   by color flow Doppler. - Tricuspid valve: There was mild regurgitation. - Pulmonary arteries: Systolic pressure was mildly increased. PA    Peak pressure: 45 mm Hg (S). 11/08/2018  Walked RA  2 laps @ 231ft each @ faster than avg pace  stopped due to  End of study, min sob no desats Spirometry 11/08/2018  FEV1 2.4 (94%)  Ratio 84  p am anoro > rec d/c and rx for  GERD as assoc hoarseness and pseudowheeze on exam  - PFT's  12/27/2018  FEV1 2.55 (97 % ) ratio 0.80  p no % improvement from saba p nothing prior to study with DLCO  89 % corrects to 125  % for alv volume    Related to conditioning/ body habitus and somewhat to L TV paralysis with no pulmonary problem identified

## 2019-08-17 NOTE — Patient Instructions (Signed)
I will call you with the results of your 02 level while on 2lpm    Please schedule a follow up visit in 6  months but call sooner if needed

## 2019-08-18 ENCOUNTER — Ambulatory Visit: Payer: Self-pay | Admitting: Internal Medicine

## 2019-08-18 ENCOUNTER — Other Ambulatory Visit: Payer: Self-pay

## 2019-08-18 ENCOUNTER — Encounter: Payer: Self-pay | Admitting: Internal Medicine

## 2019-08-18 ENCOUNTER — Ambulatory Visit: Payer: Self-pay | Admitting: Dietician

## 2019-08-18 VITALS — BP 151/65 | HR 74 | Temp 98.1°F | Ht 70.0 in | Wt 237.8 lb

## 2019-08-18 DIAGNOSIS — Z87891 Personal history of nicotine dependence: Secondary | ICD-10-CM

## 2019-08-18 DIAGNOSIS — Z79899 Other long term (current) drug therapy: Secondary | ICD-10-CM

## 2019-08-18 DIAGNOSIS — I10 Essential (primary) hypertension: Secondary | ICD-10-CM

## 2019-08-18 MED ORDER — AMLODIPINE BESYLATE 10 MG PO TABS: 10.0000 mg | ORAL_TABLET | Freq: Every day | ORAL | 1 refills | Status: DC

## 2019-08-18 MED ORDER — LOSARTAN POTASSIUM 25 MG PO TABS: 25.0000 mg | ORAL_TABLET | Freq: Every day | ORAL | 1 refills | Status: DC

## 2019-08-18 MED FILL — LOSARTAN POTASSIUM 25 MG TA: 25 | 30 days supply | Qty: 30 | Fill #0

## 2019-08-18 MED FILL — AMLODIPINE BESYLATE 10 MG T: 10 | 30 days supply | Qty: 30 | Fill #0

## 2019-08-18 NOTE — Progress Notes (Signed)
Internal Medicine Clinic Attending  Case discussed with Dr. Winfrey  at the time of the visit.  We reviewed the resident's history and exam and pertinent patient test results.  I agree with the assessment, diagnosis, and plan of care documented in the resident's note.  

## 2019-08-18 NOTE — Patient Instructions (Signed)
Isabel Chen we have started you on a new blood pressure medication called losartan at 25mg  and have increased your amlodipine to 10mg  daily.  Please return in about one month for lab work.

## 2019-08-18 NOTE — Assessment & Plan Note (Signed)
Bp still poorly controlled today on amlodipine 5mg  daily.  DOT physical is 8 October, must be <867 systolic in order to pass.    -increase amlodipine to 10mg  daily -start losartan 25mg  daily

## 2019-08-18 NOTE — Progress Notes (Signed)
  Medical Nutrition Therapy Via Telemedicine (Telephone or Video Assisted Platform):   Called. Left voicemail for return call Debera Lat, RD 08/18/2019 10:51 AM. Have not heard from patient. Will mail letter. Debera Lat, RD 08/25/2019 4:00 PM.

## 2019-08-18 NOTE — Addendum Note (Signed)
Addended by: Lalla Brothers T on: 08/18/2019 01:56 PM   Modules accepted: Level of Service

## 2019-08-18 NOTE — Progress Notes (Signed)
   CC: HTN follow up  HPI:  [name] is a 63 y.o. female with PMH below.  Today we will address HTN  Please see A&P for status of the patient's chronic medical conditions  Past Medical History:  Diagnosis Date  . Arthritis   . GERD (gastroesophageal reflux disease)   . Heart murmur yrs ago  . History of bronchitis last 2016  . Pneumonia 2010  . Thyroid disease    Review of Systems:  ROS: Pulmonary: pt denies increased work of breathing, shortness of breath,  Cardiac: pt denies palpitations, chest pain,  Abdominal: pt denies abdominal pain, nausea, vomiting, or diarrhea  Physical Exam:  Vitals:   08/18/19 0942  BP: (!) 151/65  Pulse: 74  Temp: 98.1 F (36.7 C)  TempSrc: Oral  SpO2: 100%  Weight: 237 lb 12.8 oz (107.9 kg)  Height: 5\' 10"  (1.778 m)   Cardiac: normal rate and rhythm, clear s1 and s2, no murmurs, rubs or gallops Pulmonary: CTAB, not in distress Abdominal: non distended abdomen, soft and nontender Psych: Alert, conversant, in good spirits   Social History   Socioeconomic History  . Marital status: Widowed    Spouse name: Not on file  . Number of children: Not on file  . Years of education: Not on file  . Highest education level: Not on file  Occupational History  . Not on file  Social Needs  . Financial resource strain: Not on file  . Food insecurity    Worry: Not on file    Inability: Not on file  . Transportation needs    Medical: Not on file    Non-medical: Not on file  Tobacco Use  . Smoking status: Former Smoker    Packs/day: 0.25    Years: 10.00    Pack years: 2.50    Types: Cigarettes    Quit date: 11/24/1983    Years since quitting: 35.7  . Smokeless tobacco: Never Used  Substance and Sexual Activity  . Alcohol use: Yes    Comment: glass of wine on new year's eve  . Drug use: No  . Sexual activity: Not on file  Lifestyle  . Physical activity    Days per week: Not on file    Minutes per session: Not on file  .  Stress: Not on file  Relationships  . Social Herbalist on phone: Not on file    Gets together: Not on file    Attends religious service: Not on file    Active member of club or organization: Not on file    Attends meetings of clubs or organizations: Not on file    Relationship status: Not on file  . Intimate partner violence    Fear of current or ex partner: Not on file    Emotionally abused: Not on file    Physically abused: Not on file    Forced sexual activity: Not on file  Other Topics Concern  . Not on file  Social History Narrative  . Not on file    Family History  Problem Relation Age of Onset  . Diabetes Mother   . Hypertension Mother   . Stroke Father   . Cancer Maternal Grandmother        pancreatic    Assessment & Plan:   See Encounters Tab for problem based charting.  Patient discussed with Dr. Evette Doffing

## 2019-08-25 ENCOUNTER — Encounter: Payer: Self-pay | Admitting: Dietician

## 2019-08-28 ENCOUNTER — Other Ambulatory Visit: Payer: Self-pay | Admitting: Internal Medicine

## 2019-08-31 ENCOUNTER — Encounter: Payer: BLUE CROSS/BLUE SHIELD | Admitting: Internal Medicine

## 2019-09-11 ENCOUNTER — Telehealth: Payer: Self-pay | Admitting: Internal Medicine

## 2019-09-11 NOTE — Telephone Encounter (Signed)
Received below MyChart message from patient.  Sent follow up message about oxygen order that was placed in September to see if patient was started on night time oxygen.  "CPAP VS OXYGEN?? Please...please...please...settle this situation about the use of oxygen or the use of a cpap.Marland KitchenMarland KitchenDr Melvyn Novas told me to do the sleep study WITH 2 liters of oxygen. I have never been told that I needed oxygen on a regular basis. I'm not even sure I can have this on my truck. I need clarification. Thank you."

## 2019-09-11 NOTE — Telephone Encounter (Signed)
See MyChart message from earlier today.  °

## 2019-09-14 ENCOUNTER — Ambulatory Visit: Payer: Self-pay | Admitting: Dietician

## 2019-09-14 ENCOUNTER — Encounter: Payer: BLUE CROSS/BLUE SHIELD | Admitting: Internal Medicine

## 2019-09-16 ENCOUNTER — Other Ambulatory Visit: Payer: Self-pay | Admitting: Internal Medicine

## 2019-09-20 ENCOUNTER — Telehealth: Payer: Self-pay | Admitting: Internal Medicine

## 2019-09-20 NOTE — Telephone Encounter (Signed)
Left message for patient to call back  

## 2019-09-25 ENCOUNTER — Encounter: Payer: Self-pay | Admitting: Dietician

## 2019-09-25 ENCOUNTER — Ambulatory Visit (INDEPENDENT_AMBULATORY_CARE_PROVIDER_SITE_OTHER): Payer: Self-pay | Admitting: Internal Medicine

## 2019-09-25 ENCOUNTER — Ambulatory Visit: Payer: Self-pay | Admitting: Dietician

## 2019-09-25 ENCOUNTER — Encounter: Payer: Self-pay | Admitting: Internal Medicine

## 2019-09-25 ENCOUNTER — Other Ambulatory Visit: Payer: Self-pay

## 2019-09-25 VITALS — BP 144/76 | HR 82 | Temp 98.0°F | Ht 67.0 in | Wt 335.0 lb

## 2019-09-25 DIAGNOSIS — I82452 Acute embolism and thrombosis of left peroneal vein: Secondary | ICD-10-CM

## 2019-09-25 DIAGNOSIS — Z86718 Personal history of other venous thrombosis and embolism: Secondary | ICD-10-CM

## 2019-09-25 DIAGNOSIS — R0602 Shortness of breath: Secondary | ICD-10-CM

## 2019-09-25 DIAGNOSIS — Z7901 Long term (current) use of anticoagulants: Secondary | ICD-10-CM

## 2019-09-25 DIAGNOSIS — K219 Gastro-esophageal reflux disease without esophagitis: Secondary | ICD-10-CM

## 2019-09-25 DIAGNOSIS — Z79899 Other long term (current) drug therapy: Secondary | ICD-10-CM

## 2019-09-25 DIAGNOSIS — I10 Essential (primary) hypertension: Secondary | ICD-10-CM

## 2019-09-25 MED ORDER — LOSARTAN POTASSIUM 25 MG PO TABS: 25.0000 mg | ORAL_TABLET | Freq: Every day | ORAL | 2 refills | Status: DC

## 2019-09-25 MED ORDER — RIVAROXABAN 20 MG PO TABS: ORAL_TABLET | ORAL | 1 refills | Status: DC

## 2019-09-25 MED ORDER — PANTOPRAZOLE SODIUM 40 MG PO TBEC: 40.0000 mg | DELAYED_RELEASE_TABLET | Freq: Every day | ORAL | 2 refills | Status: DC

## 2019-09-25 MED ORDER — AMLODIPINE BESYLATE 10 MG PO TABS: 10.0000 mg | ORAL_TABLET | Freq: Every day | ORAL | 1 refills | Status: DC

## 2019-09-25 MED FILL — AMLODIPINE BESYLATE 10 MG T: 10 | 30 days supply | Qty: 30 | Fill #0

## 2019-09-25 MED FILL — LOSARTAN POTASSIUM 25 MG TA: 25 | 30 days supply | Qty: 30 | Fill #0

## 2019-09-25 MED FILL — PANTOPRAZOLE SOD DR 40 MG T: 40 | 90 days supply | Qty: 90 | Fill #0

## 2019-09-25 MED FILL — XARELTO 20 MG TABLET: 20 | 30 days supply | Qty: 30 | Fill #0

## 2019-09-25 NOTE — Progress Notes (Signed)
   CC: HTN follow up   HPI:  [name] is a 63 y.o. female with PMHx as documented below, presented for follow up of HTN and medications refill. Please refer to problem based charting for further details and assessment of plan of current problem and chronic medical conditions.  Past Medical History:  Diagnosis Date  . Arthritis   . GERD (gastroesophageal reflux disease)   . Heart murmur yrs ago  . History of bronchitis last 2016  . Pneumonia 2010  . Thyroid disease    Review of Systems:  Review of Systems  Respiratory: Positive for shortness of breath. Negative for cough and wheezing.        Chronic shortness of breath  Cardiovascular: Negative for chest pain.    Physical Exam:  Vitals:   09/25/19 1359  BP: (!) 144/76  Pulse: 82  Temp: 98 F (36.7 C)  TempSrc: Oral  SpO2: 100%  Weight: (!) 335 lb (152 kg)  Height: 5\' 7"  (1.702 m)   Physical Exam  Physical Exam Constitutional:      Appearance: Normal appearance. She is not ill-appearing.  HENT:     Head: Normocephalic and atraumatic.  Cardiovascular:     Rate and Rhythm: Normal rate and regular rhythm.     Heart sounds: No murmur.     Comments: Trace bilateral lower extremity edema JVP normal Pulmonary:     Effort: Pulmonary effort is normal. No respiratory distress.     Breath sounds: Normal breath sounds. No wheezing or rales.  Abdominal:     General: Bowel sounds are normal.     Palpations: Abdomen is soft.     Tenderness: There is no abdominal tenderness.  Musculoskeletal:        General: No swelling or tenderness.  Neurological:     Mental Status: She is alert and oriented to person, place, and time.  Psychiatric:        Mood and Affect: Mood normal.        Behavior: Behavior normal.    Assessment & Plan:   See Encounters Tab for problem based charting.  Patient discussed with Dr. Dareen Piano

## 2019-09-25 NOTE — Assessment & Plan Note (Signed)
Patient has ran out of xarelto since last week. 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  -Send refill for xarelto.  -Encouraged to contact office a week before she runs off of her meds and asks for refill/appointment

## 2019-09-25 NOTE — Assessment & Plan Note (Signed)
Sent refill for Protonix.

## 2019-09-25 NOTE — Progress Notes (Signed)
Documentation: Discussed lifestyle changes to lower blood pressure during office visit with Dr. Myrtie Hawk.  Patient verbalized understanding using teach back. She states she is very motivated to make changes.  Debera Lat, RD 09/25/2019 3:10 PM.

## 2019-09-25 NOTE — Patient Instructions (Addendum)
It was our pleasure taking care of you in our clinic today.  You were seen for high blood pressure follow up. Your blood pressure is better than last visit but still elevated. It can be because you did not take your medication since last week when you ran out of them.  I sent refills for those medications. Please pick them up at takes them as instructed. I encourage you to call us or your pharmacy for refill a week before you run out of your medications next time.  I send you to lab for blood test and will call you if the result will be abnormal. Please follow up with your pulmonary doctor. We consider sleep study next time if needed. Please come back to our clinic in 3 month or earlier if your symptoms get worse or not improved. As always, please having severe symptoms, please seek medical attention at emergency room.  Thank you

## 2019-09-25 NOTE — Assessment & Plan Note (Addendum)
The patient's blood pressure during this visit was 144/74. She did not take Losartan since last week as she ran out of it.  Her last blood pressure visits are  BP Readings from Last 3 Encounters:  09/25/19 (!) 144/76  08/18/19 (!) 151/65  08/17/19 134/80    The patient does not report palpitations, dizziness, chest pain, sob.  Assessment and Plan -Continue current regimen: Amlodipine 10 mg QD, Losartan 25 mg QD. Sent refill. -BMP today -F/u in clinic in 3 months

## 2019-09-25 NOTE — Telephone Encounter (Signed)
LMTCB x2 for pt 

## 2019-09-26 LAB — BMP8+ANION GAP
Anion Gap: 16 mmol/L (ref 10.0–18.0)
BUN/Creatinine Ratio: 10 — ABNORMAL LOW (ref 12–28)
BUN: 9 mg/dL (ref 8–27)
CO2: 24 mmol/L (ref 20–29)
Calcium: 9.3 mg/dL (ref 8.7–10.3)
Chloride: 104 mmol/L (ref 96–106)
Creatinine, Ser: 0.94 mg/dL (ref 0.57–1.00)
GFR calc Af Amer: 75 mL/min/{1.73_m2} (ref >59)
GFR calc non Af Amer: 65 mL/min/{1.73_m2} (ref >59)
Glucose: 133 mg/dL — ABNORMAL HIGH (ref 65–99)
Potassium: 3.8 mmol/L (ref 3.5–5.2)
Sodium: 144 mmol/L (ref 134–144)

## 2019-09-26 NOTE — Progress Notes (Signed)
Internal Medicine Clinic Attending  Case discussed with Dr. Masoudi  at the time of the visit.  We reviewed the resident's history and exam and pertinent patient test results.  I agree with the assessment, diagnosis, and plan of care documented in the resident's note.  

## 2019-09-26 NOTE — Telephone Encounter (Signed)
Spoke with pt, she states she has not received her oxygen yet and [employer] advised her that they were waiting on an order from [place]. I advised her that the order was already sent but I called [employer] and they stated they were waiting on the patient to call them to set up oxygen. I called pt to let her know to call [employer] and ask for the referral department. Pt understood and nothing further is needed.

## 2019-09-27 ENCOUNTER — Telehealth: Payer: Self-pay | Admitting: Internal Medicine

## 2019-09-27 DIAGNOSIS — J9611 Chronic respiratory failure with hypoxia: Secondary | ICD-10-CM

## 2019-09-27 NOTE — Telephone Encounter (Signed)
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

## 2019-09-27 NOTE — Telephone Encounter (Signed)
lmtcb

## 2019-09-27 NOTE — Telephone Encounter (Signed)
Fine with me

## 2019-09-28 NOTE — Telephone Encounter (Signed)
Called and spoke to pt. Informed her of the approval from [facility]. Pt aware this would be an out of pocket cost as insurance does not pay for POCs with only a nocturnal oxygen need. Order placed. Pt verbalized understanding and denied any further questions or concerns at this time.

## 2019-10-02 ENCOUNTER — Encounter: Payer: Self-pay | Admitting: Internal Medicine

## 2019-10-04 ENCOUNTER — Encounter: Payer: Self-pay | Admitting: Internal Medicine

## 2019-10-04 NOTE — Telephone Encounter (Signed)
Have attempted to call pt and schedule appt [facility] for tomorrow, lm for rtc

## 2019-10-05 ENCOUNTER — Encounter: Payer: Self-pay | Admitting: Internal Medicine

## 2019-10-18 ENCOUNTER — Encounter: Payer: Self-pay | Admitting: Internal Medicine

## 2019-10-18 ENCOUNTER — Other Ambulatory Visit: Payer: Self-pay

## 2019-10-18 ENCOUNTER — Ambulatory Visit: Payer: Self-pay | Admitting: Internal Medicine

## 2019-10-18 VITALS — BP 144/68 | HR 87 | Temp 98.5°F | Ht 67.0 in | Wt 360.1 lb

## 2019-10-18 DIAGNOSIS — H1130 Conjunctival hemorrhage, unspecified eye: Secondary | ICD-10-CM

## 2019-10-18 DIAGNOSIS — M546 Pain in thoracic spine: Secondary | ICD-10-CM

## 2019-10-18 DIAGNOSIS — Z86711 Personal history of pulmonary embolism: Secondary | ICD-10-CM

## 2019-10-18 DIAGNOSIS — Z86718 Personal history of other venous thrombosis and embolism: Secondary | ICD-10-CM

## 2019-10-18 DIAGNOSIS — M549 Dorsalgia, unspecified: Secondary | ICD-10-CM | POA: Insufficient documentation

## 2019-10-18 DIAGNOSIS — I82452 Acute embolism and thrombosis of left peroneal vein: Secondary | ICD-10-CM

## 2019-10-18 DIAGNOSIS — Z7901 Long term (current) use of anticoagulants: Secondary | ICD-10-CM

## 2019-10-18 DIAGNOSIS — M545 Low back pain: Secondary | ICD-10-CM

## 2019-10-18 MED ORDER — IBUPROFEN 400 MG PO TABS: 400.0000 mg | ORAL_TABLET | Freq: Four times a day (QID) | ORAL | 0 refills | Status: DC | PRN

## 2019-10-18 MED FILL — IBUPROFEN 400 MG TABS: 400 | 15 days supply | Qty: 60 | Fill #0

## 2019-10-18 NOTE — Assessment & Plan Note (Signed)
HPI:  Patient presents to the clinic with acute onset back pain for a week and a half duration. Patient states that her back pain projects in a bandlike pattern with no radiation. Pain is characterized as "pressure" in nature and rates a 9/10 on the pain scale. Turning in bed aggravates her pain. Patient has not tried alleviating factors.   Assessment: Patient does have tenderness to palpation along the paraspinal muscles of the thoracic and lumbar region. Patient denies [facility], her vitals are stable, and her pain does not appear to be pleural in nature, which lowers threshold for PE.   Plan:  - Prescribed 400mg  ibuprofen Q6H PRN

## 2019-10-18 NOTE — Progress Notes (Signed)
CC: Subconjunctival Hemorrhage and Back Pain  HPI:  [name] is a 63 y.o., with a PMH noted below, presents to the clinic for new onset back pain and a follow up on her subconjunctival hemorrhage. To see the management of her acute and chronic conditions, please see the A&P note under the encounters tab.    Past Medical History:  Diagnosis Date  . Arthritis   . GERD (gastroesophageal reflux disease)   . Heart murmur yrs ago  . History of bronchitis last 2016  . Pneumonia 2010  . Thyroid disease    Review of Systems:   Review of Systems  Constitutional: Negative for chills, fever, malaise/fatigue and weight loss.  Eyes: Negative for blurred vision and double vision.  Respiratory: Negative for cough, shortness of breath and wheezing.   Cardiovascular: Negative for chest pain, palpitations and leg swelling.  Gastrointestinal: Negative for abdominal pain, blood in stool, constipation, diarrhea, melena, nausea and vomiting.  Genitourinary: Negative for frequency, hematuria and urgency.  Musculoskeletal: Positive for back pain.       Patient endorses back pain in a bandlike pattern across the thoracic and lumbar region.   Skin: Negative for rash.    Physical Exam:  Vitals:   10/18/19 0839  BP: (!) 144/68  Pulse: 87  Temp: 98.5 F (36.9 C)  TempSrc: Oral  SpO2: 100%  Weight: (!) 360 lb 1.6 oz (163.3 kg)  Height: 5\' 7"  (1.702 m)   Physical Exam Vitals signs and nursing note reviewed.  Constitutional:      General: She is not in acute distress.    Appearance: Normal appearance. She is not ill-appearing or toxic-appearing.  HENT:     Head: Normocephalic and atraumatic.     Right Ear: External ear normal.     Left Ear: External ear normal.  Eyes:     General: No scleral icterus.       Right eye: No discharge.        Left eye: No discharge.     Extraocular Movements: Extraocular movements intact.     Conjunctiva/sclera: Conjunctivae normal.     Pupils: Pupils  are equal, round, and reactive to light.  Neck:     Musculoskeletal: No muscular tenderness.  Cardiovascular:     Rate and Rhythm: Normal rate and regular rhythm.     Pulses: Normal pulses.     Heart sounds: Normal heart sounds. No murmur. No friction rub. No gallop.   Pulmonary:     Effort: Pulmonary effort is normal.     Breath sounds: Normal breath sounds. No wheezing, rhonchi or rales.  Abdominal:     General: Abdomen is flat. Bowel sounds are normal.     Palpations: Abdomen is soft.     Tenderness: There is no abdominal tenderness. There is no guarding.  Musculoskeletal:        General: No swelling or deformity.     Right lower leg: Edema present.     Left lower leg: Edema present.     Comments: Tenderness to palpation along the paraspinal muscles in the thoracic and lumbar region.  1+ Pitting edema.  Neurological:     General: No focal deficit present.     Mental Status: She is alert and oriented to person, place, and time.  Psychiatric:        Mood and Affect: Mood normal.        Behavior: Behavior normal.     Assessment & Plan:   See Encounters  Tab for problem based charting.  Patient seen with Dr. Evette Doffing

## 2019-10-18 NOTE — Patient Instructions (Addendum)
To Ms. Amalfitano,  It was a pleasure meeting you today. Today we discussed starting back your Xarelto. We have also fitted you for compression stockings as well to help reduce your lower leg swelling. We also discussed your back pain. I am giving you a two week course of ibuprofen to take for your back pain. Please take the medication as directed. You can take one pill every six hours for your pain as needed. We also discussed warm compresses over the affected area. Your medication will be at the [facility]. Be safe while you travel for work, and happy holidays! Sincerely,  Maudie Mercury, MD

## 2019-10-18 NOTE — Progress Notes (Signed)
Internal Medicine Clinic Attending  I saw and evaluated the patient.  I personally confirmed the key portions of the history and exam documented by Dr. Winters and I reviewed pertinent patient test results.  The assessment, diagnosis, and plan were formulated together and I agree with the documentation in the resident's note.  

## 2019-10-18 NOTE — Assessment & Plan Note (Addendum)
Patient, with PMH of DVT and on 20 mg Xarelto, seen in the office today after having a non traumatic subconjunctival hemorrhage likely 2/2 to xarelto use. Her last DVT event was December of 2019, and she was told to hold her xarelto until she is seen in the clinic. On physical examination her conjunctiva are clear, with no associated eye pain or photophobia.   Plan:  - Patient to restart Xarelto 20 mg - Patient measured for compression stockings

## 2019-10-23 ENCOUNTER — Other Ambulatory Visit: Payer: Self-pay | Admitting: *Deleted

## 2019-10-23 DIAGNOSIS — K219 Gastro-esophageal reflux disease without esophagitis: Secondary | ICD-10-CM

## 2019-10-23 DIAGNOSIS — I10 Essential (primary) hypertension: Secondary | ICD-10-CM

## 2019-10-23 DIAGNOSIS — I82452 Acute embolism and thrombosis of left peroneal vein: Secondary | ICD-10-CM

## 2019-10-23 MED ORDER — AMLODIPINE BESYLATE 10 MG PO TABS: 10.0000 mg | ORAL_TABLET | Freq: Every day | ORAL | 0 refills | Status: DC

## 2019-10-23 MED ORDER — PANTOPRAZOLE SODIUM 40 MG PO TBEC: 40.0000 mg | DELAYED_RELEASE_TABLET | Freq: Every day | ORAL | 0 refills | Status: DC

## 2019-10-23 MED ORDER — RIVAROXABAN 20 MG PO TABS: ORAL_TABLET | ORAL | 0 refills | Status: DC

## 2019-10-23 MED ORDER — ALBUTEROL SULFATE HFA 108 (90 BASE) MCG/ACT IN AERS: 2.0000 | INHALATION_SPRAY | Freq: Four times a day (QID) | RESPIRATORY_TRACT | 0 refills | Status: DC | PRN

## 2019-10-23 MED ORDER — IBUPROFEN 400 MG PO TABS: 400.0000 mg | ORAL_TABLET | Freq: Four times a day (QID) | ORAL | 0 refills | Status: DC | PRN

## 2019-10-23 MED ORDER — LOSARTAN POTASSIUM 25 MG PO TABS: 25.0000 mg | ORAL_TABLET | Freq: Every day | ORAL | 0 refills | Status: DC

## 2019-10-23 MED ORDER — LEVOTHYROXINE SODIUM 50 MCG PO TABS: 50.0000 ug | ORAL_TABLET | Freq: Every day | ORAL | 0 refills | Status: DC

## 2019-10-23 MED ORDER — FAMOTIDINE 20 MG PO TABS: ORAL_TABLET | ORAL | 0 refills | Status: DC

## 2019-10-23 MED FILL — ATORVASTATIN 40 MG TABLET: 40 | 30 days supply | Qty: 30 | Fill #0

## 2019-11-14 ENCOUNTER — Other Ambulatory Visit: Payer: Self-pay | Admitting: Internal Medicine

## 2019-11-14 ENCOUNTER — Encounter: Payer: Self-pay | Admitting: Internal Medicine

## 2019-11-14 DIAGNOSIS — I82452 Acute embolism and thrombosis of left peroneal vein: Secondary | ICD-10-CM

## 2019-11-14 DIAGNOSIS — K219 Gastro-esophageal reflux disease without esophagitis: Secondary | ICD-10-CM

## 2019-11-14 DIAGNOSIS — I10 Essential (primary) hypertension: Secondary | ICD-10-CM

## 2019-11-15 ENCOUNTER — Other Ambulatory Visit: Payer: Self-pay | Admitting: Internal Medicine

## 2019-11-15 DIAGNOSIS — I10 Essential (primary) hypertension: Secondary | ICD-10-CM

## 2019-11-15 DIAGNOSIS — I82452 Acute embolism and thrombosis of left peroneal vein: Secondary | ICD-10-CM

## 2019-11-15 MED ORDER — LOSARTAN POTASSIUM 25 MG PO TABS: 25.0000 mg | ORAL_TABLET | Freq: Every day | ORAL | 0 refills | Status: DC

## 2019-11-15 MED ORDER — RIVAROXABAN 20 MG PO TABS: ORAL_TABLET | ORAL | 1 refills | Status: DC

## 2019-11-15 MED ORDER — AMLODIPINE BESYLATE 10 MG PO TABS: 10.0000 mg | ORAL_TABLET | Freq: Every day | ORAL | 1 refills | Status: DC

## 2019-11-15 MED ORDER — AMLODIPINE BESYLATE 10 MG PO TABS: 10.0000 mg | ORAL_TABLET | Freq: Every day | ORAL | 0 refills | Status: DC

## 2019-11-15 MED ORDER — LOSARTAN POTASSIUM 25 MG PO TABS: 25.0000 mg | ORAL_TABLET | Freq: Every day | ORAL | 1 refills | Status: DC

## 2019-11-15 MED ORDER — RIVAROXABAN 20 MG PO TABS: ORAL_TABLET | ORAL | 0 refills | Status: DC

## 2019-11-15 MED ORDER — ATORVASTATIN CALCIUM 40 MG PO TABS: 40.0000 mg | ORAL_TABLET | Freq: Every day | ORAL | 1 refills | Status: DC

## 2019-11-15 MED ORDER — ALBUTEROL SULFATE HFA 108 (90 BASE) MCG/ACT IN AERS: 2.0000 | INHALATION_SPRAY | Freq: Four times a day (QID) | RESPIRATORY_TRACT | 0 refills | Status: DC | PRN

## 2019-11-15 MED ORDER — LEVOTHYROXINE SODIUM 50 MCG PO TABS: 50.0000 ug | ORAL_TABLET | Freq: Every day | ORAL | 0 refills | Status: DC

## 2019-11-15 MED ORDER — ATORVASTATIN CALCIUM 40 MG PO TABS: ORAL_TABLET | ORAL | 0 refills | Status: DC

## 2019-11-15 MED FILL — ATORVASTATIN 40 MG TABLET: 40 | 30 days supply | Qty: 30 | Fill #0

## 2019-11-15 MED FILL — XARELTO 20 MG TABLET: 20 | 30 days supply | Qty: 30 | Fill #0

## 2019-11-15 MED FILL — AMLODIPINE BESYLATE 10 MG T: 10 | 30 days supply | Qty: 30 | Fill #1

## 2019-11-15 MED FILL — LEVOTHYROXINE 50 MCG TABLET: 50 | 30 days supply | Qty: 30 | Fill #0

## 2019-11-15 MED FILL — LOSARTAN POTASSIUM 25 MG TA: 25 | 30 days supply | Qty: 30 | Fill #1

## 2019-11-15 MED FILL — ALBUTEROL SULFATE HFA 108 (: 108 (90 BAS | 25 days supply | Qty: 9 | Fill #0

## 2019-11-22 ENCOUNTER — Encounter: Payer: Self-pay | Admitting: Internal Medicine

## 2019-12-28 ENCOUNTER — Encounter: Payer: Self-pay | Admitting: Internal Medicine

## 2019-12-28 ENCOUNTER — Ambulatory Visit: Payer: 59 | Admitting: Internal Medicine

## 2019-12-28 VITALS — BP 132/74 | HR 86 | Temp 98.4°F | Ht 67.0 in | Wt 335.9 lb

## 2019-12-28 DIAGNOSIS — M7989 Other specified soft tissue disorders: Secondary | ICD-10-CM

## 2019-12-28 DIAGNOSIS — R0600 Dyspnea, unspecified: Secondary | ICD-10-CM

## 2019-12-28 DIAGNOSIS — M79604 Pain in right leg: Secondary | ICD-10-CM | POA: Insufficient documentation

## 2019-12-28 DIAGNOSIS — E039 Hypothyroidism, unspecified: Secondary | ICD-10-CM

## 2019-12-28 DIAGNOSIS — M79605 Pain in left leg: Secondary | ICD-10-CM | POA: Diagnosis not present

## 2019-12-28 DIAGNOSIS — Z86718 Personal history of other venous thrombosis and embolism: Secondary | ICD-10-CM

## 2019-12-28 DIAGNOSIS — Z7901 Long term (current) use of anticoagulants: Secondary | ICD-10-CM

## 2019-12-28 DIAGNOSIS — R0602 Shortness of breath: Secondary | ICD-10-CM

## 2019-12-28 DIAGNOSIS — E890 Postprocedural hypothyroidism: Secondary | ICD-10-CM | POA: Diagnosis not present

## 2019-12-28 DIAGNOSIS — R0609 Other forms of dyspnea: Secondary | ICD-10-CM

## 2019-12-28 DIAGNOSIS — R600 Localized edema: Secondary | ICD-10-CM

## 2019-12-28 NOTE — Progress Notes (Signed)
   CC: Bilateral leg swelling and pain  HPI:  [name] is a 64 y.o. female with PMHx as documented below, presented for leg swelling and pain. Please refer to problem based charting for further details and assessment and plan of current problem and chronic medical conditions.   Past Medical History:  Diagnosis Date  . Arthritis   . GERD (gastroesophageal reflux disease)   . Heart murmur yrs ago  . History of bronchitis last 2016  . Pneumonia 2010  . Thyroid disease    Review of Systems: Constitutional: Negative for chills and fever.  Respiratory: positive for DOE   Cardiovascular: Negative for chest pain and positive for leg swelling.  Gastrointestinal: Negative for abdominal pain, nausea and vomiting.  Neurological: Negative for dizziness and headaches.    Physical Exam:  Vitals:   12/28/19 1411  BP: 132/74  Pulse: 86  Temp: 98.4 F (36.9 C)  TempSrc: Oral  SpO2: 100%  Weight: (!) 335 lb 14.4 oz (152.4 kg)  Height: 5\' 7"  (1.702 m)   Physical Exam Constitutional: Well-developed and well-nourished. No acute distress.  Head: Normocephalic and atraumatic.  Eyes: Conjunctivae are normal, EOM nl Cardiovascular: RRR, nl S1S2, no murmur, no LEE Respiratory:Effort normal and breath sounds normal. No respiratory distress. No wheezes.  GI: Soft. Bowel sounds are normal. No distension. There is no tenderness.  Neurological: Is alert and oriented x 3 Skin: Not diaphoretic. No erythema.  Psychiatric: Normal mood and affect. Behavior is normal. Judgment and thought content normal.    Assessment & Plan:   See Encounters Tab for problem based charting.  Patient discussed with Dr. 

## 2019-12-28 NOTE — Patient Instructions (Signed)
It was our pleasure taking care of you in our clinic today.   Today clinically for evaluation of swelling of legs. I do not think this is due to a new clot. I send you for ultrasound of your heart (echo) and also sleep study as you also reports shortness of breath at night and with activity. Please come back to clinic in 2-4 weeks (when the are done with these studies) I send you to the lab for blood work (thyroid blood test)   Please take your medications as instructed before and contact us if you have any question or concern. As always, if having severe symptoms, please seek medical attention at emergency room.  Thank you

## 2019-12-29 LAB — TSH: TSH: 1.57 u[IU]/mL (ref 0.450–4.500)

## 2019-12-30 ENCOUNTER — Encounter: Payer: Self-pay | Admitting: Internal Medicine

## 2019-12-30 NOTE — Assessment & Plan Note (Signed)
Patient reports few months Hx of DOE, orthopnea, shortness of breath during sleep and leg swelling and has LEE on exam. Lung exam and JVP are normal.  Ordering echo for possible right side HF Also ordering in lab sleep study for probable osa/ohs

## 2019-12-30 NOTE — Assessment & Plan Note (Addendum)
Patient complains on acute on chronic bilateral leg swelling and pain. It mostly occurs at th need of the day and improves next morning. 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 She is concerned that it is another DVT because she missed her anticoagulation for a week. On exam she had bilateral 1-2+ LEE. No erythema. No difference in calves sizes. I do not think that this is DVT and it seems to be due to volume overload as she also has orthopnea and DOE vs initial presentation of varicosis vein.   I will order echo for evaluation of CHF. -Echo

## 2019-12-30 NOTE — Assessment & Plan Note (Signed)
Checking TSH today.

## 2020-01-01 NOTE — Progress Notes (Signed)
Internal Medicine Clinic Attending  Case discussed with Dr. Masoudi  at the time of the visit.  We reviewed the resident's history and exam and pertinent patient test results.  I agree with the assessment, diagnosis, and plan of care documented in the resident's note.  

## 2020-01-02 ENCOUNTER — Ambulatory Visit (HOSPITAL_COMMUNITY)
Admission: RE | Admit: 2020-01-02 | Discharge: 2020-01-02 | Disposition: A | Discharge status: Home / Self Care | Payer: 59 | Source: Ambulatory Visit | Attending: Internal Medicine | Admitting: Internal Medicine

## 2020-01-02 ENCOUNTER — Other Ambulatory Visit: Payer: Self-pay

## 2020-01-02 DIAGNOSIS — R600 Localized edema: Secondary | ICD-10-CM

## 2020-01-02 DIAGNOSIS — M79604 Pain in right leg: Secondary | ICD-10-CM | POA: Diagnosis not present

## 2020-01-02 DIAGNOSIS — I10 Essential (primary) hypertension: Secondary | ICD-10-CM | POA: Insufficient documentation

## 2020-01-02 DIAGNOSIS — M79605 Pain in left leg: Secondary | ICD-10-CM | POA: Diagnosis present

## 2020-01-02 DIAGNOSIS — I700 Atherosclerosis of aorta: Secondary | ICD-10-CM | POA: Insufficient documentation

## 2020-01-02 NOTE — Progress Notes (Signed)
  Echocardiogram 2D Echocardiogram has been performed.  Delcie Roch 01/02/2020, 9:45 AM

## 2020-01-04 ENCOUNTER — Telehealth: Payer: Self-pay | Admitting: Internal Medicine

## 2020-01-04 NOTE — Telephone Encounter (Signed)
Pt calling to f/u with an order for a Sleep Study.  A Nocturnal Polysomnography was ordered on 12/28/2019 but sent to the wrong site.  Spoke with [facility] and the request is for a new ordered be sent over to the [facility] site instead of [place].  Please advise if a new order can be placed for the patient.

## 2020-01-05 NOTE — Addendum Note (Signed)
Addended byChevis Pretty on: 01/05/2020 04:43 PM   Modules accepted: Orders

## 2020-01-05 NOTE — Telephone Encounter (Signed)
Thanks.  Please place a new order for the Isabel Chen per their request so that the patient can be sch.

## 2020-01-08 NOTE — Telephone Encounter (Signed)
Thank you will reach out to [facility] to have the patient sch.

## 2020-01-10 NOTE — Telephone Encounter (Signed)
Isabel Chen @ the [facility] will be contacting the pt's [employer] ins and contacting the patient to be scheduled.  If the patient has any questions or concerns regarding scheduling please have the patient to call  [facility] @ [phone].

## 2020-01-29 ENCOUNTER — Other Ambulatory Visit (HOSPITAL_COMMUNITY)
Admission: RE | Admit: 2020-01-29 | Discharge: 2020-01-29 | Disposition: A | Discharge status: Home / Self Care | Payer: 59 | Source: Ambulatory Visit | Attending: Internal Medicine | Admitting: Internal Medicine

## 2020-01-29 DIAGNOSIS — Z01812 Encounter for preprocedural laboratory examination: Secondary | ICD-10-CM | POA: Diagnosis present

## 2020-01-29 DIAGNOSIS — Z20822 Contact with and (suspected) exposure to covid-19: Secondary | ICD-10-CM | POA: Diagnosis not present

## 2020-01-29 LAB — SARS CORONAVIRUS 2 (TAT 6-24 HRS): SARS Coronavirus 2: NEGATIVE

## 2020-01-31 ENCOUNTER — Ambulatory Visit (HOSPITAL_BASED_OUTPATIENT_CLINIC_OR_DEPARTMENT_OTHER): Payer: 59 | Attending: Internal Medicine | Admitting: Internal Medicine

## 2020-01-31 ENCOUNTER — Other Ambulatory Visit: Payer: Self-pay

## 2020-01-31 DIAGNOSIS — R0902 Hypoxemia: Secondary | ICD-10-CM | POA: Diagnosis not present

## 2020-01-31 DIAGNOSIS — I493 Ventricular premature depolarization: Secondary | ICD-10-CM | POA: Insufficient documentation

## 2020-01-31 DIAGNOSIS — R0683 Snoring: Secondary | ICD-10-CM | POA: Insufficient documentation

## 2020-01-31 DIAGNOSIS — R0602 Shortness of breath: Secondary | ICD-10-CM

## 2020-01-31 DIAGNOSIS — G4733 Obstructive sleep apnea (adult) (pediatric): Secondary | ICD-10-CM | POA: Diagnosis present

## 2020-02-03 DIAGNOSIS — R0602 Shortness of breath: Secondary | ICD-10-CM

## 2020-02-03 NOTE — Procedures (Signed)
    Patient Name: Isabel Chen, Isabel Chen Date: 01/31/2020 Gender: Female D.O.B: [birth year] Age (years): 39 Referring Provider: Chevis Pretty MD Height (inches): 70 Interpreting Physician: Jetty Duhamel MD, ABSM Weight (lbs): 335 RPSGT: Shelah Lewandowsky BMI: 48 MRN: [id number] Neck Size: 15.50  CLINICAL INFORMATION Sleep Study Type: NPSG Indication for sleep study: Hypertension, Obesity, Witnesses Apnea / Gasping During Sleep Epworth Sleepiness Score: 8  SLEEP STUDY TECHNIQUE As per the AASM Manual for the Scoring of Sleep and Associated Events v2.3 (April 2016) with a hypopnea requiring 4% desaturations.  The channels recorded and monitored were frontal, central and occipital EEG, electrooculogram (EOG), submentalis EMG (chin), nasal and oral airflow, thoracic and abdominal wall motion, anterior tibialis EMG, snore microphone, electrocardiogram, and pulse oximetry.  MEDICATIONS Medications self-administered by patient taken the night of the study : FAMOTIDINE  SLEEP ARCHITECTURE The study was initiated at 10:20:08 PM and ended at 5:33:02 AM.  Sleep onset time was 20.7 minutes and the sleep efficiency was 78.1%%. The total sleep time was 338.2 minutes.  Stage REM latency was 69.5 minutes.  The patient spent 11.7%% of the night in stage N1 sleep, 65.4%% in stage N2 sleep, 0.0%% in stage N3 and 22.9% in REM.  Alpha intrusion was absent.  Supine sleep was 22.67%.  RESPIRATORY PARAMETERS The overall apnea/hypopnea index (AHI) was 13.0 per hour. There were 0 total apneas, including 0 obstructive, 0 central and 0 mixed apneas. There were 73 hypopneas and 20 RERAs.  The AHI during Stage REM sleep was 31.7 per hour.  AHI while supine was 14.9 per hour.  The mean oxygen saturation was 92.6%. The minimum SpO2 during sleep was 76.0%.  moderate snoring was noted during this study.  CARDIAC DATA The 2 lead EKG demonstrated sinus rhythm. The mean heart rate was 66.1 beats  per minute. Other EKG findings include: PVCs.  LEG MOVEMENT DATA The total PLMS were 0 with a resulting PLMS index of 0.0. Associated arousal with leg movement index was 0.0 .  IMPRESSIONS - Mild obstructive sleep apnea occurred during this study (AHI = 13.0/h). - No significant central sleep apnea occurred during this study (CAI = 0.0/h). - Moderate oxygen desaturation was noted during this study (Min O2 = 76.0%). Mean sat 93.4%. - The patient snored with moderate snoring volume. - EKG findings include PVCs. - Clinically significant periodic limb movements did not occur during sleep.  - Incidental bruxism noted once.  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 Other options would be based on clinical judgment. - Consider oral bite guard for bruxism if persistent. - Be careful with alcohol, sedatives and other CNS depressants that may worsen sleep apnea and disrupt normal sleep architecture. - Sleep hygiene should be reviewed to assess factors that may improve sleep quality. - Weight management and regular exercise should be initiated or continued if appropriate.  [Electronically signed] 02/03/2020 11:37 AM  Jetty Duhamel MD, ABSM Diplomate, American Board of Sleep Medicine   NPI: [id number]                         Jetty Duhamel Diplomate, American Board of Sleep Medicine  ELECTRONICALLY SIGNED ON:  02/03/2020, 11:33 AM [facility] PH: [phone]   FX: [phone] ACCREDITED BY THE AMERICAN ACADEMY OF SLEEP MEDICINE

## 2020-02-06 ENCOUNTER — Telehealth: Payer: Self-pay

## 2020-02-06 DIAGNOSIS — G4733 Obstructive sleep apnea (adult) (pediatric): Secondary | ICD-10-CM

## 2020-02-06 NOTE — Telephone Encounter (Signed)
Requesting sleep study results, please call pt back.  

## 2020-02-14 ENCOUNTER — Ambulatory Visit: Payer: Self-pay | Admitting: Internal Medicine

## 2020-02-21 ENCOUNTER — Ambulatory Visit: Payer: Self-pay | Admitting: Internal Medicine

## 2020-02-29 ENCOUNTER — Encounter: Payer: Self-pay | Admitting: Internal Medicine

## 2020-03-05 ENCOUNTER — Encounter: Payer: Self-pay | Admitting: Internal Medicine

## 2020-03-06 NOTE — Addendum Note (Signed)
Addended by: Maura Crandall on: 03/06/2020 04:21 PM   Modules accepted: Orders

## 2020-03-06 NOTE — Telephone Encounter (Signed)
New order placed default auto settings-community message sent to [employer] that settings have been placed on order.Criss Alvine, Marquavious Nazar Cassady4/14/20214:28 PM    RE: CPAP Received: Today Message Contents  Leilani Able, CMA; Henderson Newcomer; Colin Benton, Mamie C, [place]; [place], [place] L, RN  Yes, there has to be an auto setting range on the Rx. The default setting is Auto 4-20, but can be any range in between.       Previous Messages   ----- Message -----  From: Maura Crandall, CMA  Sent: 03/05/2020  4:32 PM EDT  To: Henderson Newcomer, Arlee Muslim, *  Subject: CPAP                       MD placed order for cpap with auto titration. There are no additional settings (range) on order and pt is requesting cpap as soon as possible. Will this order need to be changed and if so what are the default settings for auto titration? Thank you in advance for your help    Mikeya Tomasetti "Purnell [facility]emaker [facility]  [phone]

## 2020-03-09 ENCOUNTER — Other Ambulatory Visit (HOSPITAL_COMMUNITY)
Admission: RE | Admit: 2020-03-09 | Discharge: 2020-03-09 | Disposition: A | Discharge status: Home / Self Care | Payer: 59 | Source: Ambulatory Visit | Attending: Internal Medicine | Admitting: Internal Medicine

## 2020-03-09 DIAGNOSIS — Z20822 Contact with and (suspected) exposure to covid-19: Secondary | ICD-10-CM | POA: Diagnosis not present

## 2020-03-09 DIAGNOSIS — Z01812 Encounter for preprocedural laboratory examination: Secondary | ICD-10-CM | POA: Diagnosis present

## 2020-03-09 LAB — SARS CORONAVIRUS 2 (TAT 6-24 HRS): SARS Coronavirus 2: NEGATIVE

## 2020-03-11 ENCOUNTER — Other Ambulatory Visit: Payer: Self-pay | Admitting: Internal Medicine

## 2020-03-11 ENCOUNTER — Other Ambulatory Visit: Payer: Self-pay | Admitting: *Deleted

## 2020-03-11 DIAGNOSIS — J9611 Chronic respiratory failure with hypoxia: Secondary | ICD-10-CM

## 2020-03-12 ENCOUNTER — Ambulatory Visit: Payer: Self-pay | Admitting: Internal Medicine

## 2020-03-12 ENCOUNTER — Other Ambulatory Visit: Payer: Self-pay | Admitting: Adult Health

## 2020-03-12 ENCOUNTER — Ambulatory Visit (INDEPENDENT_AMBULATORY_CARE_PROVIDER_SITE_OTHER): Payer: 59 | Admitting: Internal Medicine

## 2020-03-12 ENCOUNTER — Other Ambulatory Visit: Payer: Self-pay

## 2020-03-12 ENCOUNTER — Ambulatory Visit (INDEPENDENT_AMBULATORY_CARE_PROVIDER_SITE_OTHER): Payer: 59 | Admitting: Adult Health

## 2020-03-12 ENCOUNTER — Ambulatory Visit (INDEPENDENT_AMBULATORY_CARE_PROVIDER_SITE_OTHER): Payer: 59

## 2020-03-12 ENCOUNTER — Encounter: Payer: Self-pay | Admitting: Adult Health

## 2020-03-12 VITALS — BP 116/70 | HR 88 | Ht 70.0 in | Wt 323.8 lb

## 2020-03-12 DIAGNOSIS — J9611 Chronic respiratory failure with hypoxia: Secondary | ICD-10-CM

## 2020-03-12 DIAGNOSIS — G4733 Obstructive sleep apnea (adult) (pediatric): Secondary | ICD-10-CM | POA: Diagnosis not present

## 2020-03-12 DIAGNOSIS — R05 Cough: Secondary | ICD-10-CM

## 2020-03-12 DIAGNOSIS — R058 Other specified cough: Secondary | ICD-10-CM

## 2020-03-12 LAB — PULMONARY FUNCTION TEST
DL/VA % pred: 91 %
DL/VA: 3.66 ml/min/mmHg/L
DLCO cor % pred: 70 %
DLCO cor: 17.23 ml/min/mmHg
DLCO unc % pred: 70 %
DLCO unc: 17.23 ml/min/mmHg
FEF 25-75 Post: 1.17 L/sec
FEF 25-75 Pre: 1.89 L/sec
FEF2575-%Change-Post: -38 %
FEF2575-%Pred-Post: 48 %
FEF2575-%Pred-Pre: 78 %
FEV1-%Change-Post: -8 %
FEV1-%Pred-Post: 66 %
FEV1-%Pred-Pre: 72 %
FEV1-Post: 1.72 L
FEV1-Pre: 1.88 L
FEV1FVC-%Change-Post: 6 %
FEV1FVC-%Pred-Pre: 101 %
FEV6-%Change-Post: -13 %
FEV6-%Pred-Post: 63 %
FEV6-%Pred-Pre: 73 %
FEV6-Post: 2.03 L
FEV6-Pre: 2.35 L
FEV6FVC-%Pred-Post: 103 %
FEV6FVC-%Pred-Pre: 103 %
FVC-%Change-Post: -13 %
FVC-%Pred-Post: 61 %
FVC-%Pred-Pre: 71 %
FVC-Post: 2.03 L
FVC-Pre: 2.35 L
Post FEV1/FVC ratio: 85 %
Post FEV6/FVC ratio: 100 %
Pre FEV1/FVC ratio: 80 %
Pre FEV6/FVC Ratio: 100 %
RV % pred: 85 %
RV: 2 L
TLC % pred: 87 %
TLC: 5.2 L

## 2020-03-12 MED ORDER — ALBUTEROL SULFATE HFA 108 (90 BASE) MCG/ACT IN AERS: 2.0000 | INHALATION_SPRAY | Freq: Four times a day (QID) | RESPIRATORY_TRACT | 5 refills | Status: DC | PRN

## 2020-03-12 NOTE — Progress Notes (Signed)
@Patient  ID: , female    DOB: [birth year], 64 y.o.   MRN: [id number]  Chief Complaint  Patient presents with  . Follow-up    Referring provider: [id number], *  HPI: 64 year old female followed for upper airway cough, nocturnal hypoxemia DVT diagnosed December 2019  TEST/EVENTS :  PFT's  12/27/2018  FEV1 2.55 (97 % ) ratio 0.80  p no % improvement from saba p nothing prior to study with DLCO  89 % corrects to 125  % for alv volume   2D echo 2019 grade 1 diastolic dysfunction, Pulmonary arteries: Systolic pressure was mildly increased. PA   Peak pressure: 45 mm Hg (S). Sleep study January 31, 2020 mild sleep apnea with AHI 13/hour, SPO2 low 76%.  ONO  11/27/2018 RA x 1 h 01/26/2019 > 12/29/2018  rec repeat on 2lpm > done 08/03/19 "RA" desat < 89% x 10/03/19 >> 08/08/2019  Rec repeat ONO on  2lpm    03/12/2020 Follow up : Cough , nocturnal hypoxemia Patient returns for a 44-month follow-up.  Patient says since last visit she is doing better.  Her cough has resolved and has decreased substantially.  She says she is trying to be more active.  Does get short of breath with heavy activity but is trying to do more things as much as possible.  Patient had pulmonary function testing today that shows slight decline in lung function with FEV1 at 72%, ratio is 80, FVC 71%, DLCO 70%, no significant bronchodilator response.  She had a recent sleep study that was done in March that showed mild sleep apnea.  She has been started on CPAP and is waiting for her CPAP to arrive. She was previously on oxygen at nighttime has not been wearing this and will be starting CPAP soon.  She wants to get an order to discontinue her CPAP.   Allergies  Allergen Reactions  . Tetracyclines & Related Hives    Immunization History  Administered Date(s) Administered  . Influenza,inj,Quad PF,6+ Mos 10/30/2018, 08/04/2019    Past Medical History:  Diagnosis Date  . Arthritis   . GERD (gastroesophageal  reflux disease)   . Heart murmur yrs ago  . History of bronchitis last 2016  . Pneumonia 2010  . Thyroid disease     Tobacco History: Social History   Tobacco Use  Smoking Status Former Smoker  . Packs/day: 0.25  . Years: 10.00  . Pack years: 2.50  . Types: Cigarettes  . Quit date: 11/24/1983  . Years since quitting: 36.3  Smokeless Tobacco Never Used   Counseling given: Not Answered   Outpatient Medications Prior to Visit  Medication Sig Dispense Refill  . albuterol (VENTOLIN HFA) 108 (90 Base) MCG/ACT inhaler Inhale 2 puffs into the lungs every 6 (six) hours as needed for wheezing or shortness of breath. 18 g 0  . amLODipine (NORVASC) 10 MG tablet Take 1 tablet (10 mg total) by mouth daily. 90 tablet 0  . atorvastatin (LIPITOR) 40 MG tablet Take 1 tablet (40 mg total) by mouth daily at 6 PM. IM PROGRAM 90 tablet 1  . famotidine (PEPCID) 20 MG tablet One at bedtime 90 tablet 0  . levothyroxine (SYNTHROID) 50 MCG tablet Take 1 tablet (50 mcg total) by mouth daily. 90 tablet 0  . losartan (COZAAR) 25 MG tablet Take 1 tablet (25 mg total) by mouth daily. 30 tablet 1  . pantoprazole (PROTONIX) 40 MG tablet Take 1 tablet (40 mg total) by mouth daily. Take  30-60 min before first meal of the day 90 tablet 0  . rivaroxaban (XARELTO) 20 MG TABS tablet TAKE 1 TABLET BY MOUTH DAILY WITH SUPPER 30 tablet 1   No facility-administered medications prior to visit.     Review of Systems:   Constitutional:   No  weight loss, night sweats,  Fevers, chills,  +fatigue, or  lassitude.  HEENT:   No headaches,  Difficulty swallowing,  Tooth/dental problems, or  Sore throat,                No sneezing, itching, ear ache, nasal congestion, post nasal drip,   CV:  No chest pain,  Orthopnea, PND, swelling in lower extremities, anasarca, dizziness, palpitations, syncope.   GI  No heartburn, indigestion, abdominal pain, nausea, vomiting, diarrhea, change in bowel habits, loss of appetite, bloody  stools.   Resp:    No excess mucus, no productive cough,  No non-productive cough,  No coughing up of blood.  No change in color of mucus.  No wheezing.  No chest wall deformity  Skin: no rash or lesions.  GU: no dysuria, change in color of urine, no urgency or frequency.  No flank pain, no hematuria   MS:  No joint pain or swelling.  No decreased range of motion.  No back pain.    Physical Exam  BP 116/70 (BP Location: Right Arm, Cuff Size: Large)   Pulse 88   Ht 5\' 10"  (1.778 m)   Wt (!) 323 lb 12.8 oz (146.9 kg)   SpO2 98%   BMI 46.46 kg/m   GEN: A/Ox3; pleasant , NAD, BMI 46   HEENT:  [place]/AT,   , NOSE-clear, THROAT-clear, no lesions, no postnasal drip or exudate noted.   NECK:  Supple w/ fair ROM; no JVD; normal carotid impulses w/o bruits; no thyromegaly or nodules palpated; no lymphadenopathy.    RESP  Clear  P & A; w/o, wheezes/ rales/ or rhonchi. no accessory muscle use, no dullness to percussion  CARD:  RRR, no m/r/g, no peripheral edema, pulses intact, no cyanosis or clubbing.  GI:   Soft & nt; nml bowel sounds; no organomegaly or masses detected.   Musco: Warm bil, no deformities or joint swelling noted.   Neuro: alert, no focal deficits noted.    Skin: Warm, no lesions or rashes    Lab Results:    ProBNP No results found for: PROBNP  Imaging: No results found.    PFT Results Latest Ref Rng & Units 03/12/2020 12/27/2018  FVC-Pre L 2.35 3.21  FVC-Predicted Pre % 71 96  FVC-Post L 2.03 2.92  FVC-Predicted Post % 61 88  Pre FEV1/FVC % % 80 80  Post FEV1/FCV % % 85 55  FEV1-Pre L 1.88 2.55  FEV1-Predicted Pre % 72 97  FEV1-Post L 1.72 1.61  DLCO UNC% % 70 89  DLCO COR %Predicted % 91 125  TLC L 5.20 5.19  TLC % Predicted % 87 87  RV % Predicted % 85 89    No results found for: NITRICOXIDE      Assessment & Plan:   No problem-specific Assessment & Plan notes found for this encounter.     Rexene Edison, NP 03/12/2020

## 2020-03-12 NOTE — Progress Notes (Signed)
PFT done today. 

## 2020-03-12 NOTE — Patient Instructions (Addendum)
Chest xray today .  May use Ventolin Inhaler As needed  Wheezing /shortness of breath.  Begin CPAP At bedtime  As planned.  May d/c Oxygen .  Work on healthy weight loss.  Follow up with Dr. Sherene Sires  In 3-4 months and As needed

## 2020-03-13 ENCOUNTER — Encounter: Payer: Self-pay | Admitting: Internal Medicine

## 2020-03-14 ENCOUNTER — Encounter: Payer: Self-pay | Admitting: Internal Medicine

## 2020-03-15 DIAGNOSIS — G4733 Obstructive sleep apnea (adult) (pediatric): Secondary | ICD-10-CM | POA: Insufficient documentation

## 2020-03-15 NOTE — Assessment & Plan Note (Signed)
Cough is improved.  She has [facility] the [facility]. Patient has a known left vocal fold paralysis.  She has had an evaluation with ENT. Continue on voice exercises.  Continue on trigger prevention cough control.

## 2020-03-15 NOTE — Assessment & Plan Note (Signed)
Nocturnal hypoxemia.  Most likely secondary to obstructive sleep apnea.  Patient is now beginning CPAP.  She can discontinue her home oxygen.

## 2020-03-15 NOTE — Assessment & Plan Note (Signed)
Recently diagnosed obstructive sleep apnea.  Patient to begin CPAP as directed.

## 2020-03-23 ENCOUNTER — Other Ambulatory Visit: Payer: Self-pay | Admitting: Internal Medicine

## 2020-03-23 DIAGNOSIS — I10 Essential (primary) hypertension: Secondary | ICD-10-CM

## 2020-04-12 ENCOUNTER — Other Ambulatory Visit: Payer: Self-pay | Admitting: Internal Medicine

## 2020-04-12 DIAGNOSIS — I10 Essential (primary) hypertension: Secondary | ICD-10-CM

## 2020-05-02 ENCOUNTER — Encounter: Payer: Self-pay | Admitting: Internal Medicine

## 2020-06-06 ENCOUNTER — Encounter: Payer: 59 | Admitting: Internal Medicine

## 2020-06-10 ENCOUNTER — Encounter: Payer: Self-pay | Admitting: Internal Medicine

## 2020-06-10 ENCOUNTER — Other Ambulatory Visit: Payer: Self-pay

## 2020-06-10 ENCOUNTER — Ambulatory Visit: Payer: 59 | Admitting: Internal Medicine

## 2020-06-10 DIAGNOSIS — I10 Essential (primary) hypertension: Secondary | ICD-10-CM | POA: Diagnosis not present

## 2020-06-10 DIAGNOSIS — K219 Gastro-esophageal reflux disease without esophagitis: Secondary | ICD-10-CM

## 2020-06-10 DIAGNOSIS — M545 Low back pain, unspecified: Secondary | ICD-10-CM

## 2020-06-10 DIAGNOSIS — I82452 Acute embolism and thrombosis of left peroneal vein: Secondary | ICD-10-CM

## 2020-06-10 DIAGNOSIS — M546 Pain in thoracic spine: Secondary | ICD-10-CM | POA: Diagnosis not present

## 2020-06-10 MED ORDER — LOSARTAN POTASSIUM 25 MG PO TABS: 25.0000 mg | ORAL_TABLET | Freq: Every day | ORAL | 2 refills | Status: DC

## 2020-06-10 MED ORDER — METHOCARBAMOL 500 MG PO TABS: 500.0000 mg | ORAL_TABLET | Freq: Four times a day (QID) | ORAL | 0 refills | Status: AC

## 2020-06-10 MED ORDER — ALBUTEROL SULFATE HFA 108 (90 BASE) MCG/ACT IN AERS: 2.0000 | INHALATION_SPRAY | Freq: Four times a day (QID) | RESPIRATORY_TRACT | 5 refills | Status: DC | PRN

## 2020-06-10 MED ORDER — FAMOTIDINE 20 MG PO TABS: ORAL_TABLET | ORAL | 0 refills | Status: DC

## 2020-06-10 MED ORDER — RIVAROXABAN 20 MG PO TABS: ORAL_TABLET | ORAL | 1 refills | Status: DC

## 2020-06-10 MED ORDER — AMLODIPINE BESYLATE 10 MG PO TABS: ORAL_TABLET | ORAL | 0 refills | Status: DC

## 2020-06-10 MED ORDER — PANTOPRAZOLE SODIUM 40 MG PO TBEC: 40.0000 mg | DELAYED_RELEASE_TABLET | Freq: Every day | ORAL | 0 refills | Status: DC

## 2020-06-10 MED ORDER — ATORVASTATIN CALCIUM 40 MG PO TABS: ORAL_TABLET | ORAL | 1 refills | Status: DC

## 2020-06-10 NOTE — Patient Instructions (Addendum)
Isabel Chen,  It was nice meeting you. Today we discussed your back pain which is due to muscle spasms. I think we need to try taking you out of work for a week to try to improve your symptoms.   -use the Tens unit a few times per day -continue heating intermittently throughout the day -start doing some basic stretches at home  Follow-up with your primary care doctor as scheduled.   Take care, Dr. Chesley Mires  Low Back Sprain or Strain Rehab Ask your health care provider which exercises are safe for you. Do exercises exactly as told by your health care provider and adjust them as directed. It is normal to feel mild stretching, pulling, tightness, or discomfort as you do these exercises. Stop right away if you feel sudden pain or your pain gets worse. Do not begin these exercises until told by your health care provider. Stretching and range-of-motion exercises These exercises warm up your muscles and joints and improve the movement and flexibility of your back. These exercises also help to relieve pain, numbness, and tingling. Lumbar rotation  1. Lie on your back on a firm surface and bend your knees. 2. Straighten your arms out to your sides so each arm forms a 90-degree angle (right angle) with a side of your body. 3. Slowly move (rotate) both of your knees to one side of your body until you feel a stretch in your lower back (lumbar). Try not to let your shoulders lift off the floor. 4. Hold this position for 60 seconds. 5. Tense your abdominal muscles and slowly move your knees back to the starting position. 6. Repeat this exercise on the other side of your body. Repeat 2 times. Complete this exercise 2 times a day. Single knee to chest  1. Lie on your back on a firm surface with both legs straight. 2. Bend one of your knees. Use your hands to move your knee up toward your chest until you feel a gentle stretch in your lower back and buttock. ? Hold your leg in this position by holding on  to the front of your knee. ? Keep your other leg as straight as possible. 3. Hold this position for 20 seconds. 4. Slowly return to the starting position. 5. Repeat with your other leg. Repeat 2 times. Complete this exercise 2 times a day. Prone extension on elbows  1. Lie on your abdomen on a firm surface (prone position). 2. Prop yourself up on your elbows. 3. Use your arms to help lift your chest up until you feel a gentle stretch in your abdomen and your lower back. ? This will place some of your body weight on your elbows. If this is uncomfortable, try stacking pillows under your chest. ? Your hips should stay down, against the surface that you are lying on. Keep your hip and back muscles relaxed. 4. Hold this position for 30 seconds. 5. Slowly relax your upper body and return to the starting position. Repeat 2 times. Complete this exercise 2 times a day. Strengthening exercises These exercises build strength and endurance in your back. Endurance is the ability to use your muscles for a long time, even after they get tired. Pelvic tilt This exercise strengthens the muscles that lie deep in the abdomen. 1. Lie on your back on a firm surface. Bend your knees and keep your feet flat on the floor. 2. Tense your abdominal muscles. Tip your pelvis up toward the ceiling and flatten your lower back into  the floor. ? To help with this exercise, you may place a small towel under your lower back and try to push your back into the towel. 3. Hold this position for 20 seconds. 4. Let your muscles relax completely before you repeat this exercise. Repeat 3 times. Complete this exercise 2 times a day. Alternating arm and leg raises  1. Get on your hands and knees on a firm surface. If you are on a hard floor, you may want to use padding, such as an exercise mat, to cushion your knees. 2. Line up your arms and legs. Your hands should be directly below your shoulders, and your knees should be directly  below your hips. 3. Lift your left leg behind you. At the same time, raise your right arm and straighten it in front of you. ? Do not lift your leg higher than your hip. ? Do not lift your arm higher than your shoulder. ? Keep your abdominal and back muscles tight. ? Keep your hips facing the ground. ? Do not arch your back. ? Keep your balance carefully, and do not hold your breath. 4. Hold this position for 10 seconds. 5. Slowly return to the starting position. 6. Repeat with your right leg and your left arm. Repeat 3 times. Complete this exercise 2 times a day. Abdominal set with straight leg raise  1. Lie on your back on a firm surface. 2. Bend one of your knees and keep your other leg straight. 3. Tense your abdominal muscles and lift your straight leg up, 4-6 inches (10-15 cm) off the ground. 4. Keep your abdominal muscles tight and hold this position for 5-10 seconds. ? Do not hold your breath. ? Do not arch your back. Keep it flat against the ground. 5. Keep your abdominal muscles tense as you slowly lower your leg back to the starting position. 6. Repeat with your other leg. Repeat 5 times. Complete this exercise 2 times a day. Single leg lower with bent knees 1. Lie on your back on a firm surface. 2. Tense your abdominal muscles and lift your feet off the floor, one foot at a time, so your knees and hips are bent in 90-degree angles (right angles). ? Your knees should be over your hips and your lower legs should be parallel to the floor. 3. Keeping your abdominal muscles tense and your knee bent, slowly lower one of your legs so your toe touches the ground. 4. Lift your leg back up to return to the starting position. ? Do not hold your breath. ? Do not let your back arch. Keep your back flat against the ground. 5. Repeat with your other leg. Repeat 10 times. Complete this exercise 2 times a day. Posture and body mechanics Good posture and healthy body mechanics can help to  relieve stress in your body's tissues and joints. Body mechanics refers to the movements and positions of your body while you do your daily activities. Posture is part of body mechanics. Good posture means:  Your spine is in its natural S-curve position (neutral).  Your shoulders are pulled back slightly.  Your head is not tipped forward. Follow these guidelines to improve your posture and body mechanics in your everyday activities. Standing   When standing, keep your spine neutral and your feet about hip width apart. Keep a slight bend in your knees. Your ears, shoulders, and hips should line up.  When you do a task in which you stand in one place for  a long time, place one foot up on a stable object that is 2-4 inches (5-10 cm) high, such as a footstool. This helps keep your spine neutral. Sitting   When sitting, keep your spine neutral and keep your feet flat on the floor. Use a footrest, if necessary, and keep your thighs parallel to the floor. Avoid rounding your shoulders, and avoid tilting your head forward.  When working at a desk or a computer, keep your desk at a height where your hands are slightly lower than your elbows. Slide your chair under your desk so you are close enough to maintain good posture.  When working at a computer, place your monitor at a height where you are looking straight ahead and you do not have to tilt your head forward or downward to look at the screen. Resting  When lying down and resting, avoid positions that are most painful for you.  If you have pain with activities such as sitting, bending, stooping, or squatting, lie in a position in which your body does not bend very much. For example, avoid curling up on your side with your arms and knees near your chest (fetal position).  If you have pain with activities such as standing for a long time or reaching with your arms, lie with your spine in a neutral position and bend your knees slightly. Try the  following positions: ? Lying on your side with a pillow between your knees. ? Lying on your back with a pillow under your knees. Lifting   When lifting objects, keep your feet at least shoulder width apart and tighten your abdominal muscles.  Bend your knees and hips and keep your spine neutral. It is important to lift using the strength of your legs, not your back. Do not lock your knees straight out.  Always ask for help to lift heavy or awkward objects. This information is not intended to replace advice given to you by your health care provider. Make sure you discuss any questions you have with your health care provider. Document Revised: 03/03/2019 Document Reviewed: 12/01/2018 Elsevier Patient Education  2020 [employer].

## 2020-06-11 ENCOUNTER — Other Ambulatory Visit: Payer: Self-pay | Admitting: *Deleted

## 2020-06-11 ENCOUNTER — Encounter: Payer: Self-pay | Admitting: Internal Medicine

## 2020-06-11 DIAGNOSIS — I10 Essential (primary) hypertension: Secondary | ICD-10-CM

## 2020-06-11 DIAGNOSIS — I82452 Acute embolism and thrombosis of left peroneal vein: Secondary | ICD-10-CM

## 2020-06-11 MED ORDER — RIVAROXABAN 20 MG PO TABS: ORAL_TABLET | ORAL | 1 refills | Status: DC

## 2020-06-11 MED ORDER — LOSARTAN POTASSIUM 25 MG PO TABS: 25.0000 mg | ORAL_TABLET | Freq: Every day | ORAL | 1 refills | Status: DC

## 2020-06-11 NOTE — Assessment & Plan Note (Addendum)
Patient presents with low back pain that began 3 months ago while she was walking. The pain is primarily right-sided and described as intermittent spasms. 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 She has been using heat and taking prescribed Robaxin with moderate relief.  On exam she has tenderness and spasm to right paraspinal muscles. No red flag symptoms such as fevers, spine tenderness, bowel or bladder incontinence, or saddle anesthesia.   A/P Pain most consistent with paraspinal spasms that are exacerbated due to the nature of her work.  -note written for her to remain out of work for the next week -continue with heat, muscle relaxants, and tylenol -home exercise and stretching program provided  -she requests a back brace to wear while driving for more support; DME order written

## 2020-06-11 NOTE — Progress Notes (Signed)
Acute Office Visit  Subjective:    Patient ID: Isabel Chen, female    DOB: 08-Apr-1956, 64 y.o.   MRN: [id number]  Chief Complaint  Patient presents with  . Back Pain    HPI Patient is in today for back pain. Please see problem based charting for further details.   Past Medical History:  Diagnosis Date  . Arthritis   . GERD (gastroesophageal reflux disease)   . Heart murmur yrs ago  . History of bronchitis last 2016  . Pneumonia 2010  . Thyroid disease     Past Surgical History:  Procedure Laterality Date  . ABDOMINAL HYSTERECTOMY     partial  . APPENDECTOMY  yrs ago   with exploratory surgery  . BUNIONECTOMY Right   . exploratory surgery  yrs ago  . HIP SURGERY Right    for bursa  . ROTATOR CUFF REPAIR Right   . THYROID LOBECTOMY Left 06/25/2016   Procedure: LEFT THYROID LOBECTOMY;  Surgeon: Darnell Level, MD;  Location: [facility];  Service: General;  Laterality: Left;    Family History  Problem Relation Age of Onset  . Diabetes Mother   . Hypertension Mother   . Stroke Father   . Cancer Maternal Grandmother        pancreatic    Social History   Socioeconomic History  . Marital status: Widowed    Spouse name: Not on file  . Number of children: Not on file  . Years of education: Not on file  . Highest education level: Not on file  Occupational History  . Not on file  Tobacco Use  . Smoking status: Former Smoker    Packs/day: 0.25    Years: 10.00    Pack years: 2.50    Types: Cigarettes    Quit date: 11/24/1983    Years since quitting: 36.5  . Smokeless tobacco: Never Used  Vaping Use  . Vaping Use: Never used  Substance and Sexual Activity  . Alcohol use: Yes    Comment: glass of wine on new year's eve  . Drug use: No  . Sexual activity: Not on file  Other Topics Concern  . Not on file  Social History Narrative  . Not on file   Social Determinants of Health   Financial Resource Strain:   . Difficulty of Paying Living Expenses:   Food  Insecurity:   . Worried About Programme researcher, broadcasting/film/video in the Last Year:   . Barista in the Last Year:   Transportation Needs:   . Freight forwarder (Medical):   Marland Kitchen Lack of Transportation (Non-Medical):   Physical Activity:   . Days of Exercise per Week:   . Minutes of Exercise per Session:   Stress:   . Feeling of Stress :   Social Connections:   . Frequency of Communication with Friends and Family:   . Frequency of Social Gatherings with Friends and Family:   . Attends Religious Services:   . Active Member of Clubs or Organizations:   . Attends Banker Meetings:   Marland Kitchen Marital Status:   Intimate Partner Violence:   . Fear of Current or Ex-Partner:   . Emotionally Abused:   Marland Kitchen Physically Abused:   . Sexually Abused:     Outpatient Medications Prior to Visit  Medication Sig Dispense Refill  . levothyroxine (SYNTHROID) 50 MCG tablet TAKE 1 TABLET(50 MCG) BY MOUTH DAILY 90 tablet 1  . albuterol (VENTOLIN HFA) 108 (90  Base) MCG/ACT inhaler Inhale 2 puffs into the lungs every 6 (six) hours as needed for wheezing or shortness of breath. 18 g 5  . amLODipine (NORVASC) 10 MG tablet TAKE 1 TABLET(10 MG) BY MOUTH DAILY 90 tablet 0  . atorvastatin (LIPITOR) 40 MG tablet TAKE 1 TABLET BY MOUTH DAILY AT 6 PM 90 tablet 1  . famotidine (PEPCID) 20 MG tablet One at bedtime 90 tablet 0  . losartan (COZAAR) 25 MG tablet Take 1 tablet (25 mg total) by mouth daily. 30 tablet 2  . pantoprazole (PROTONIX) 40 MG tablet Take 1 tablet (40 mg total) by mouth daily. Take 30-60 min before first meal of the day 90 tablet 0  . rivaroxaban (XARELTO) 20 MG TABS tablet TAKE 1 TABLET BY MOUTH DAILY WITH SUPPER 30 tablet 1   No facility-administered medications prior to visit.    Allergies  Allergen Reactions  . Tetracyclines & Related Hives    Review of Systems  Constitutional: Negative for chills and fever.  Musculoskeletal: Negative for arthralgias, joint swelling and neck pain.    Skin: Negative for rash.  Neurological: Negative for weakness and numbness.  Psychiatric/Behavioral: Negative for sleep disturbance.       Objective:    Physical Exam Constitutional:      General: She is not in acute distress.    Appearance: Normal appearance.  Musculoskeletal:     Lumbar back: Spasms present. Decreased range of motion. Negative right straight leg raise test and negative left straight leg raise test.  Skin:    Findings: No bruising, erythema or rash.  Neurological:     Mental Status: She is alert.     Sensory: No sensory deficit.     Motor: No weakness.     Gait: Gait normal.     BP 121/66 (BP Location: Left Arm, Patient Position: Sitting, Cuff Size: Large)   Pulse 74   Temp 98.5 F (36.9 C) (Oral)   Ht 5\' 10"  (1.778 m)   Wt (!) 325 lb 14.4 oz (147.8 kg)   SpO2 100% Comment: room air  BMI 46.76 kg/m  Wt Readings from Last 3 Encounters:  06/10/20 (!) 325 lb 14.4 oz (147.8 kg)  03/12/20 (!) 323 lb 12.8 oz (146.9 kg)  01/31/20 (!) 335 lb (152 kg)    Health Maintenance Due  Topic Date Due  . Hepatitis C Screening  Never done  . COVID-19 Vaccine (1) Never done  . TETANUS/TDAP  Never done  . PAP SMEAR-Modifier  Never done  . COLONOSCOPY  Never done  . MAMMOGRAM  03/11/2018    There are no preventive care reminders to display for this patient.   Lab Results  Component Value Date   TSH 1.570 12/28/2019   Lab Results  Component Value Date   WBC 9.2 10/29/2018   HGB 11.5 (L) 10/29/2018   HCT 36.2 10/29/2018   MCV 85.2 10/29/2018   PLT 317 10/29/2018   Lab Results  Component Value Date   NA 144 09/25/2019   K 3.8 09/25/2019   CO2 24 09/25/2019   GLUCOSE 133 (H) 09/25/2019   BUN 9 09/25/2019   CREATININE 0.94 09/25/2019   CALCIUM 9.3 09/25/2019   ANIONGAP 10 10/29/2018   Lab Results  Component Value Date   CHOL 204 (H) 10/30/2018   Lab Results  Component Value Date   HDL 45 10/30/2018   Lab Results  Component Value Date    LDLCALC 139 (H) 10/30/2018   Lab Results  Component  Value Date   TRIG 99 10/30/2018   Lab Results  Component Value Date   CHOLHDL 4.5 10/30/2018   Lab Results  Component Value Date   HGBA1C 6.3 (H) 10/30/2018       Assessment & Plan:   Problem List Items Addressed This Visit      Cardiovascular and Mediastinum   DVT (deep venous thrombosis) (HCC)   Relevant Medications   amLODipine (NORVASC) 10 MG tablet   atorvastatin (LIPITOR) 40 MG tablet   losartan (COZAAR) 25 MG tablet   rivaroxaban (XARELTO) 20 MG TABS tablet   Hypertension   Relevant Medications   amLODipine (NORVASC) 10 MG tablet   atorvastatin (LIPITOR) 40 MG tablet   losartan (COZAAR) 25 MG tablet   rivaroxaban (XARELTO) 20 MG TABS tablet     Digestive   GERD (gastroesophageal reflux disease)   Relevant Medications   famotidine (PEPCID) 20 MG tablet   pantoprazole (PROTONIX) 40 MG tablet     Other   Acute back pain    Patient presents with low back pain that began 3 months ago while she was walking. The pain is primarily right-sided and described as intermittent spasms. 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 She has been using heat and taking prescribed Robaxin with moderate relief.  On exam she has tenderness and spasm to right paraspinal muscles. No red flag symptoms such as fevers, spine tenderness, bowel or bladder incontinence, or saddle anesthesia.   A/P Pain most consistent with paraspinal spasms that are exacerbated due to the nature of her work.  -note written for her to remain out of work for the next week -continue with heat, muscle relaxants, and tylenol -she requests a back brace to wear while driving for more support; DME order written       Relevant Medications   methocarbamol (ROBAXIN) 500 MG tablet       Meds ordered this encounter  Medications  . albuterol  (VENTOLIN HFA) 108 (90 Base) MCG/ACT inhaler    Sig: Inhale 2 puffs into the lungs every 6 (six) hours as needed for wheezing or shortness of breath.    Dispense:  18 g    Refill:  5    IM program  . amLODipine (NORVASC) 10 MG tablet    Sig: TAKE 1 TABLET(10 MG) BY MOUTH DAILY    Dispense:  90 tablet    Refill:  0  . atorvastatin (LIPITOR) 40 MG tablet    Sig: TAKE 1 TABLET BY MOUTH DAILY AT 6 PM    Dispense:  90 tablet    Refill:  1  . famotidine (PEPCID) 20 MG tablet    Sig: One at bedtime    Dispense:  90 tablet    Refill:  0  . losartan (COZAAR) 25 MG tablet    Sig: Take 1 tablet (25 mg total) by mouth daily.    Dispense:  30 tablet    Refill:  2  . pantoprazole (PROTONIX) 40 MG tablet    Sig: Take 1 tablet (40 mg total) by mouth daily. Take 30-60 min before first meal of the day    Dispense:  90 tablet    Refill:  0  . rivaroxaban (XARELTO) 20 MG TABS tablet    Sig: TAKE 1 TABLET BY MOUTH DAILY WITH SUPPER    Dispense:  30 tablet  Refill:  1    [facility] program  . methocarbamol (ROBAXIN) 500 MG tablet    Sig: Take 1 tablet (500 mg total) by mouth 4 (four) times daily.    Dispense:  120 tablet    Refill:  0     Pietrina Jagodzinski D Odessie Polzin, DO

## 2020-06-12 NOTE — Progress Notes (Signed)
Internal Medicine Clinic Attending  Case discussed with Dr. Bloomfield  At the time of the visit.  We reviewed the resident's history and exam and pertinent patient test results.  I agree with the assessment, diagnosis, and plan of care documented in the resident's note.  

## 2020-07-08 ENCOUNTER — Encounter: Payer: 59 | Admitting: Internal Medicine

## 2020-07-12 ENCOUNTER — Ambulatory Visit: Payer: 59 | Admitting: Internal Medicine

## 2020-07-22 ENCOUNTER — Encounter: Payer: 59 | Admitting: Internal Medicine

## 2020-08-28 ENCOUNTER — Encounter: Payer: Self-pay | Admitting: Internal Medicine

## 2020-09-13 ENCOUNTER — Encounter: Payer: Self-pay | Admitting: *Deleted

## 2020-09-16 ENCOUNTER — Other Ambulatory Visit: Payer: Self-pay | Admitting: Internal Medicine

## 2020-09-23 ENCOUNTER — Other Ambulatory Visit: Payer: Self-pay

## 2020-09-23 ENCOUNTER — Encounter: Payer: Self-pay | Admitting: Internal Medicine

## 2020-09-23 ENCOUNTER — Ambulatory Visit (INDEPENDENT_AMBULATORY_CARE_PROVIDER_SITE_OTHER): Payer: 59 | Admitting: Internal Medicine

## 2020-09-23 ENCOUNTER — Other Ambulatory Visit: Payer: Self-pay | Admitting: Internal Medicine

## 2020-09-23 VITALS — BP 146/83 | HR 82 | Wt 335.8 lb

## 2020-09-23 DIAGNOSIS — Z23 Encounter for immunization: Secondary | ICD-10-CM | POA: Diagnosis not present

## 2020-09-23 DIAGNOSIS — I10 Essential (primary) hypertension: Secondary | ICD-10-CM | POA: Diagnosis not present

## 2020-09-23 DIAGNOSIS — M545 Low back pain, unspecified: Secondary | ICD-10-CM

## 2020-09-23 DIAGNOSIS — Z0000 Encounter for general adult medical examination without abnormal findings: Secondary | ICD-10-CM | POA: Diagnosis not present

## 2020-09-23 DIAGNOSIS — I82452 Acute embolism and thrombosis of left peroneal vein: Secondary | ICD-10-CM

## 2020-09-23 DIAGNOSIS — Z1231 Encounter for screening mammogram for malignant neoplasm of breast: Secondary | ICD-10-CM

## 2020-09-23 MED ORDER — CELECOXIB 100 MG PO CAPS: 100.0000 mg | ORAL_CAPSULE | Freq: Two times a day (BID) | ORAL | 0 refills | Status: DC | PRN

## 2020-09-23 MED FILL — CELECOXIB 100 MG CAP: 100 | 15 days supply | Qty: 30 | Fill #0

## 2020-09-23 NOTE — Patient Instructions (Signed)
Thank you for allowing [place] to provide your care today.   I ordered Celebrex for your back pain. Take 1 tablet once or twice a day with food and water. I also refer you to physical therapy for muscle spasm. Continue using heat and ice.  No change in rest of medications. F/u in clinic in 3 month or sooner if needed. Should you have any questions or concerns please call the internal medicine clinic at [phone].

## 2020-09-23 NOTE — Progress Notes (Signed)
Established Patient Office Visit  Subjective:  Patient ID: Isabel Chen, female    DOB: 1956/09/04  Age: 64 y.o. MRN: [id number]  CC:  Chief Complaint  Patient presents with  . Back Pain  . Flu Vaccine  Back pain  HPI Isabel Chen presents for follow up of back pain.  Please refer to problem based charting for further details and assessment and plan.  Patient Active Problem List   Diagnosis Date Noted  . Healthcare maintenance 09/24/2020  . OSA (obstructive sleep apnea) 03/15/2020  . Leg pain, bilateral 12/28/2019  . Acute back pain 10/18/2019  . GERD (gastroesophageal reflux disease)   . Hypertension 08/04/2019  . Chronic respiratory failure with hypoxia (HCC)/ noct hypoxemia  12/29/2018  . Upper airway cough syndrome ? VCD  12/27/2018  . DVT (deep venous thrombosis) (HCC) 11/13/2018  . Hypothyroidism 11/13/2018  . Morbid obesity due to excess calories (HCC) complicated by spont L DVT 11/09/2018  . Acute deep vein thrombosis (DVT) of left peroneal vein (HCC)   . Dyspnea on exertion 10/29/2018  . 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 COUGH 07/18/2010    Past Medical History:  Diagnosis Date  . Arthritis   . GERD (gastroesophageal reflux disease)   . Heart murmur yrs ago  . History of bronchitis last 2016  . Pneumonia 2010  . Thyroid disease     Past Surgical History:  Procedure Laterality Date  . ABDOMINAL HYSTERECTOMY     partial  . APPENDECTOMY  yrs ago   with exploratory surgery  . BUNIONECTOMY Right   . exploratory surgery  yrs ago  . HIP SURGERY Right    for bursa  . ROTATOR CUFF REPAIR Right   . THYROID LOBECTOMY Left 06/25/2016   Procedure: LEFT THYROID LOBECTOMY;  Surgeon: Darnell Level, MD;  Location: [facility];  Service: General;  Laterality: Left;    Family History  Problem Relation Age of Onset  . Diabetes Mother   . Hypertension Mother   . Stroke Father   . Cancer Maternal Grandmother        pancreatic    Social History    Socioeconomic History  . Marital status: Widowed    Spouse name: Not on file  . Number of children: Not on file  . Years of education: Not on file  . Highest education level: Not on file  Occupational History  . Not on file  Tobacco Use  . Smoking status: Former Smoker    Packs/day: 0.25    Years: 10.00    Pack years: 2.50    Types: Cigarettes    Quit date: 11/24/1983    Years since quitting: 36.8  . Smokeless tobacco: Never Used  Vaping Use  . Vaping Use: Never used  Substance and Sexual Activity  . Alcohol use: Yes    Comment: glass of wine on new year's eve  . Drug use: No  . Sexual activity: Not on file  Other Topics Concern  . Not on file  Social History Narrative  . Not on file   Social Determinants of Health   Financial Resource Strain:   . Difficulty of Paying Living Expenses: Not on file  Food Insecurity:   . Worried About Programme researcher, broadcasting/film/video in the Last Year: Not on file  . Ran Out of Food in the Last Year: Not on file  Transportation Needs:   . Lack of Transportation (Medical): Not on file  . Lack of Transportation (Non-Medical): Not on  file  Physical Activity:   . Days of Exercise per Week: Not on file  . Minutes of Exercise per Session: Not on file  Stress:   . Feeling of Stress : Not on file  Social Connections:   . Frequency of Communication with Friends and Family: Not on file  . Frequency of Social Gatherings with Friends and Family: Not on file  . Attends Religious Services: Not on file  . Active Member of Clubs or Organizations: Not on file  . Attends Banker Meetings: Not on file  . Marital Status: Not on file  Intimate Partner Violence:   . Fear of Current or Ex-Partner: Not on file  . Emotionally Abused: Not on file  . Physically Abused: Not on file  . Sexually Abused: Not on file    Outpatient Medications Prior to Visit  Medication Sig Dispense Refill  . albuterol (VENTOLIN HFA) 108 (90 Base) MCG/ACT inhaler Inhale 2  puffs into the lungs every 6 (six) hours as needed for wheezing or shortness of breath. 18 g 5  . amLODipine (NORVASC) 10 MG tablet TAKE 1 TABLET(10 MG) BY MOUTH DAILY 90 tablet 0  . atorvastatin (LIPITOR) 40 MG tablet TAKE 1 TABLET BY MOUTH DAILY AT 6 PM 90 tablet 1  . famotidine (PEPCID) 20 MG tablet One at bedtime 90 tablet 0  . levothyroxine (SYNTHROID) 50 MCG tablet TAKE 1 TABLET(50 MCG) BY MOUTH DAILY 90 tablet 1  . losartan (COZAAR) 25 MG tablet Take 1 tablet (25 mg total) by mouth daily. 90 tablet 1  . pantoprazole (PROTONIX) 40 MG tablet Take 1 tablet (40 mg total) by mouth daily. Take 30-60 min before first meal of the day 90 tablet 0  . rivaroxaban (XARELTO) 20 MG TABS tablet TAKE 1 TABLET BY MOUTH DAILY WITH SUPPER 90 tablet 1   No facility-administered medications prior to visit.    Allergies  Allergen Reactions  . Tetracyclines & Related Hives    ROS Review of Systems   Negative except as mentioned in HPI and assessment and plan. Objective:     Today's Vitals   09/23/20 1432  BP: (!) 146/83  Pulse: 82  SpO2: 99%  Weight: (!) 335 lb 12.8 oz (152.3 kg)   Body mass index is 48.18 kg/m. Physical Exam Constitutional:      General: She is not in acute distress.    Appearance: She is obese. She is not ill-appearing.  Cardiovascular:     Rate and Rhythm: Normal rate and regular rhythm.     Pulses: Normal pulses.     Heart sounds: Normal heart sounds. No murmur heard.   Pulmonary:     Effort: Pulmonary effort is normal. No respiratory distress.     Breath sounds: Normal breath sounds. No wheezing or rales.  Abdominal:     General: Bowel sounds are normal.     Palpations: Abdomen is soft.     Tenderness: There is no abdominal tenderness. There is no right CVA tenderness or left CVA tenderness.  Musculoskeletal:     Right lower leg: No edema.     Left lower leg: No edema.     Comments: Tenderness in right lower back.  No spinal tenderness.  No CVA angle  tenderness  Skin:    General: Skin is warm and dry.     Findings: No bruising, lesion or rash.  Neurological:     General: No focal deficit present.     Mental Status: She is alert  and oriented to person, place, and time.     Sensory: No sensory deficit.     Motor: No weakness.  Psychiatric:        Mood and Affect: Mood normal.        Behavior: Behavior normal.        Thought Content: Thought content normal.        Judgment: Judgment normal.     BP (!) 146/83 (BP Location: Left Arm, Patient Position: Sitting, Cuff Size: Normal)   Pulse 82   Wt (!) 335 lb 12.8 oz (152.3 kg)   SpO2 99%   BMI 48.18 kg/m  Wt Readings from Last 3 Encounters:  09/23/20 (!) 335 lb 12.8 oz (152.3 kg)  06/10/20 (!) 325 lb 14.4 oz (147.8 kg)  03/12/20 (!) 323 lb 12.8 oz (146.9 kg)     Health Maintenance Due  Topic Date Due  . Hepatitis C Screening  Never done  . COVID-19 Vaccine (1) Never done  . TETANUS/TDAP  Never done  . PAP SMEAR-Modifier  Never done  . COLONOSCOPY  Never done  . MAMMOGRAM  03/11/2018    There are no preventive care reminders to display for this patient.  Lab Results  Component Value Date   TSH 1.570 12/28/2019   Lab Results  Component Value Date   WBC 9.2 10/29/2018   HGB 11.5 (L) 10/29/2018   HCT 36.2 10/29/2018   MCV 85.2 10/29/2018   PLT 317 10/29/2018   Lab Results  Component Value Date   NA 144 09/25/2019   K 3.8 09/25/2019   CO2 24 09/25/2019   GLUCOSE 133 (H) 09/25/2019   BUN 9 09/25/2019   CREATININE 0.94 09/25/2019   CALCIUM 9.3 09/25/2019   ANIONGAP 10 10/29/2018   Lab Results  Component Value Date   CHOL 204 (H) 10/30/2018   Lab Results  Component Value Date   HDL 45 10/30/2018   Lab Results  Component Value Date   LDLCALC 139 (H) 10/30/2018   Lab Results  Component Value Date   TRIG 99 10/30/2018   Lab Results  Component Value Date   CHOLHDL 4.5 10/30/2018   Lab Results  Component Value Date   HGBA1C 6.3 (H) 10/30/2018       Assessment & Plan:   Problem List Items Addressed This Visit      Cardiovascular and Mediastinum   DVT (deep venous thrombosis) (HCC)    Hx of DVT: She is on Xarelto 20 mg QD. Denies significant bleeding -Will continue anticoagulation as long as having risk factor (she is a Naval architecttruck driver).  We discussed the possibility of decreasing her Xarelto dose.  Patient would like to discuss at next visit.      Hypertension     Other   Acute back pain    Patient presented for follow-up.  Still has some back pain.  He finished methocarbamol the month ago.  Mentions that he did not help that much.  Also tried heat.  She did not get the brace that was ordered.  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  Mentions that her back pain is mostly when she moves suddenly.  She is okay with walking and standing. On exam she has tenderness at right lower back.  No actual CV angle tenderness and no spinal tenderness.  Neurovascular exam of right lower extremity is intact.  No pain at the hip, knee. Her presentation is suggestive of muscle spasm.  Given no major improvement.  I referred her to physical therapy and also try celecoxib.  -Celecoxib 100 mg twice daily as needed.  Instructed to take with food water -Letter referral to physical therapy -Continue using heat and ice as needed      Relevant Medications   celecoxib (CELEBREX) 100 MG capsule   Other Relevant Orders   Ambulatory referral to Physical Therapy   Healthcare maintenance    Received flu vaccine today.       Other Visit Diagnoses    Screening mammogram for breast cancer    -  Primary   Relevant Orders   MM DIGITAL SCREENING BILATERAL   Need for immunization against influenza       Relevant Orders   Flu Vaccine QUAD 36+ mos IM (Completed)      Meds ordered this encounter  Medications  . celecoxib (CELEBREX) 100 MG capsule    Sig: Take 1 capsule (100 mg total) by mouth 2 (two) times daily as needed.    Dispense:  30  capsule    Refill:  0    Follow-up: Return if symptoms worsen or fail to improve, for Back pain.    Chevis Pretty, MD

## 2020-09-24 ENCOUNTER — Encounter: Payer: Self-pay | Admitting: Internal Medicine

## 2020-09-24 DIAGNOSIS — Z1211 Encounter for screening for malignant neoplasm of colon: Secondary | ICD-10-CM | POA: Insufficient documentation

## 2020-09-24 DIAGNOSIS — Z0000 Encounter for general adult medical examination without abnormal findings: Secondary | ICD-10-CM | POA: Insufficient documentation

## 2020-09-24 NOTE — Assessment & Plan Note (Signed)
Hx of DVT: She is on Xarelto 20 mg QD. Denies significant bleeding -Will continue anticoagulation as long as having risk factor (she is a Naval architect).  We discussed the possibility of decreasing her Xarelto dose.  Patient would like to discuss at next visit.

## 2020-09-24 NOTE — Assessment & Plan Note (Signed)
Patient presented for follow-up.  Still has some back pain.  He finished methocarbamol the month ago.  Mentions that he did not help that much.  Also tried heat.  She did not get the brace that was ordered.  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  Mentions that her back pain is mostly when she moves suddenly.  She is okay with walking and standing. On exam she has tenderness at right lower back.  No actual CV angle tenderness and no spinal tenderness.  Neurovascular exam of right lower extremity is intact.  No pain at the hip, knee. Her presentation is suggestive of muscle spasm.  Given no major improvement.  I referred her to physical therapy and also try celecoxib.  -Celecoxib 100 mg twice daily as needed.  Instructed to take with food water -Letter referral to physical therapy -Continue using heat and ice as needed

## 2020-09-24 NOTE — Assessment & Plan Note (Deleted)
Pt is on amlodipine 10 mg QD and losartan 25 mg QD. Last Bmp was unremarkable as below: CMP Latest Ref Rng & Units 09/25/2019 08/04/2019 10/29/2018  Glucose 65 - 99 mg/dL 182(X) 937(J) 85  BUN 8 - 27 mg/dL 9 12 13   Creatinine 0.57 - 1.00 mg/dL 6.96 7.89  Sodium 134 - 144 mmol/L 144 142 144  Potassium 3.5 - 5.2 mmol/L 3.8 4.2 3.9  Chloride 96 - 106 mmol/L 104 104 109  CO2 20 - 29 mmol/L 24 24 25   Calcium 8.7 - 10.3 mg/dL 9.3 9.5 9.3    -Continue current meds -Encourage to follow NASH diet

## 2020-09-24 NOTE — Assessment & Plan Note (Signed)
Received flu vaccine today

## 2020-09-27 NOTE — Progress Notes (Signed)
[facility] Attending  Case discussed with Dr. Masoudi  At the time of the visit.  We reviewed the resident's history and exam and pertinent patient test results.  I agree with the assessment, diagnosis, and plan of care documented in the resident's note.  

## 2020-10-24 NOTE — Addendum Note (Signed)
Addended by: Neomia Dear on: 10/24/2020 05:33 PM   Modules accepted: Orders

## 2020-12-23 ENCOUNTER — Encounter: Payer: Self-pay | Admitting: Internal Medicine

## 2020-12-23 ENCOUNTER — Other Ambulatory Visit: Payer: Self-pay | Admitting: Internal Medicine

## 2020-12-23 DIAGNOSIS — I82452 Acute embolism and thrombosis of left peroneal vein: Secondary | ICD-10-CM

## 2020-12-23 DIAGNOSIS — K219 Gastro-esophageal reflux disease without esophagitis: Secondary | ICD-10-CM

## 2020-12-24 ENCOUNTER — Other Ambulatory Visit: Payer: Self-pay

## 2020-12-24 NOTE — Telephone Encounter (Signed)
Requesting to speak with a nurse about meds. Please call pt back.  

## 2020-12-25 MED ORDER — CAMPHOR-MENTHOL 0.5-0.5 % EX LOTN: 1.0000 "application " | TOPICAL_LOTION | CUTANEOUS | 1 refills | Status: AC | PRN

## 2020-12-25 MED ORDER — ATORVASTATIN CALCIUM 40 MG PO TABS: ORAL_TABLET | ORAL | 1 refills | Status: DC

## 2020-12-25 NOTE — Telephone Encounter (Signed)
Please let her know that it is very difficult to assess this without seeing her in person, though the pictures are helpful. I have written some Sarna cream which may help with the symptom of itching, but not the underlying problem. Please have her schedule an appointment when she is able. 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

## 2020-12-25 NOTE — Telephone Encounter (Signed)
Return pt's call - stated she sent My chart message along with pictures of problem of her skin/rash. Stated her skin swells; occurs on different areas of her body. Stated the itching is very bad. I asked if she has tried Benadryl - stated "yes" but does not really helps, she takes it at night to help her sleep. And she takes Equate brand allergy relief during the day, upt to 3 tabs. 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 Stated she mainly need something for the itching.

## 2020-12-25 NOTE — Telephone Encounter (Signed)
Pt called / informed of Dr Nicky Pugh response. Stated she will make an appt as soon as she can. And will let us know if the Sarna cream helps

## 2020-12-27 ENCOUNTER — Telehealth: Payer: Self-pay | Admitting: Adult Health

## 2020-12-27 DIAGNOSIS — J9611 Chronic respiratory failure with hypoxia: Secondary | ICD-10-CM

## 2020-12-27 NOTE — Telephone Encounter (Signed)
Spoke with patient. She is requesting an order to [employer] to D/C her oxygen. She stated that she has not used the oxygen in almost a year now. Has not felt the need for O2.  She was last seen in April 2020 and was told that an order would be placed. It was mentioned in the AVS but the order was never placed. I advised her that since it has been over 6 months, I would need to check with [facility] before placing the order. She verbalized understanding.   TP, please advise if you are still ok with [place] placing an order to D/C her O2. Thanks!

## 2020-12-27 NOTE — Telephone Encounter (Signed)
That is fine to discontinue oxygen .  It was supposed to happen at last visit.

## 2020-12-27 NOTE — Telephone Encounter (Signed)
Spoke with patient. She is aware [facility] is ok with the order. Order has been placed. Nothing further needed at time of call.

## 2021-03-19 ENCOUNTER — Other Ambulatory Visit: Payer: Self-pay | Admitting: Student

## 2021-03-19 ENCOUNTER — Other Ambulatory Visit: Payer: Self-pay | Admitting: Internal Medicine

## 2021-03-19 DIAGNOSIS — I10 Essential (primary) hypertension: Secondary | ICD-10-CM

## 2021-03-19 DIAGNOSIS — K219 Gastro-esophageal reflux disease without esophagitis: Secondary | ICD-10-CM

## 2021-04-14 ENCOUNTER — Encounter: Payer: Self-pay | Admitting: Internal Medicine

## 2021-04-15 ENCOUNTER — Encounter: Payer: Self-pay | Admitting: Internal Medicine

## 2021-04-21 ENCOUNTER — Encounter: Payer: Self-pay | Admitting: *Deleted

## 2021-05-09 ENCOUNTER — Encounter: Payer: Self-pay | Admitting: Internal Medicine

## 2021-05-09 ENCOUNTER — Ambulatory Visit (INDEPENDENT_AMBULATORY_CARE_PROVIDER_SITE_OTHER): Payer: 59 | Admitting: Internal Medicine

## 2021-05-09 ENCOUNTER — Other Ambulatory Visit (HOSPITAL_COMMUNITY)
Admission: RE | Admit: 2021-05-09 | Discharge: 2021-05-09 | Disposition: A | Discharge status: Home / Self Care | Payer: 59 | Source: Ambulatory Visit | Attending: Internal Medicine | Admitting: Internal Medicine

## 2021-05-09 ENCOUNTER — Other Ambulatory Visit: Payer: Self-pay

## 2021-05-09 ENCOUNTER — Other Ambulatory Visit (HOSPITAL_COMMUNITY): Payer: Self-pay

## 2021-05-09 VITALS — BP 135/83 | HR 80 | Temp 97.7°F | Ht 70.0 in | Wt 330.9 lb

## 2021-05-09 DIAGNOSIS — R310 Gross hematuria: Secondary | ICD-10-CM | POA: Diagnosis not present

## 2021-05-09 DIAGNOSIS — E8881 Metabolic syndrome: Secondary | ICD-10-CM

## 2021-05-09 DIAGNOSIS — E119 Type 2 diabetes mellitus without complications: Secondary | ICD-10-CM | POA: Diagnosis not present

## 2021-05-09 DIAGNOSIS — I10 Essential (primary) hypertension: Secondary | ICD-10-CM | POA: Diagnosis not present

## 2021-05-09 DIAGNOSIS — Z0000 Encounter for general adult medical examination without abnormal findings: Secondary | ICD-10-CM | POA: Diagnosis not present

## 2021-05-09 DIAGNOSIS — E039 Hypothyroidism, unspecified: Secondary | ICD-10-CM | POA: Diagnosis not present

## 2021-05-09 DIAGNOSIS — N888 Other specified noninflammatory disorders of cervix uteri: Secondary | ICD-10-CM

## 2021-05-09 DIAGNOSIS — L509 Urticaria, unspecified: Secondary | ICD-10-CM

## 2021-05-09 DIAGNOSIS — Z1159 Encounter for screening for other viral diseases: Secondary | ICD-10-CM | POA: Diagnosis not present

## 2021-05-09 DIAGNOSIS — Z124 Encounter for screening for malignant neoplasm of cervix: Secondary | ICD-10-CM | POA: Insufficient documentation

## 2021-05-09 DIAGNOSIS — Z23 Encounter for immunization: Secondary | ICD-10-CM

## 2021-05-09 DIAGNOSIS — Z1211 Encounter for screening for malignant neoplasm of colon: Secondary | ICD-10-CM

## 2021-05-09 DIAGNOSIS — Z9189 Other specified personal risk factors, not elsewhere classified: Secondary | ICD-10-CM

## 2021-05-09 DIAGNOSIS — I82452 Acute embolism and thrombosis of left peroneal vein: Secondary | ICD-10-CM

## 2021-05-09 LAB — POCT GLYCOSYLATED HEMOGLOBIN (HGB A1C): Hemoglobin A1C: 7 % — AB (ref 4.0–5.6)

## 2021-05-09 LAB — GLUCOSE, CAPILLARY: Glucose-Capillary: 116 mg/dL — ABNORMAL HIGH (ref 70–99)

## 2021-05-09 MED ORDER — OZEMPIC (0.25 OR 0.5 MG/DOSE) 2 MG/1.5ML ~~LOC~~ SOPN
PEN_INJECTOR | SUBCUTANEOUS | 1 refills | Status: DC
  Filled 2021-05-09: qty 1.5, 28d supply, fill #0

## 2021-05-09 MED ORDER — OZEMPIC (0.25 OR 0.5 MG/DOSE) 2 MG/1.5ML ~~LOC~~ SOPN
PEN_INJECTOR | SUBCUTANEOUS | 1 refills | Status: DC
Start: 2021-05-09 — End: 2021-08-11
  Filled 2021-05-09: qty 1.5, 42d supply, fill #0
  Filled 2021-06-24: qty 1.5, 28d supply, fill #1
  Filled 2021-08-06: qty 1.5, 28d supply, fill #2

## 2021-05-09 MED ORDER — RIVAROXABAN 20 MG PO TABS
20.0000 mg | ORAL_TABLET | Freq: Every day | ORAL | 1 refills | Status: DC
  Filled 2021-05-09: qty 90, 90d supply, fill #0
  Filled 2021-06-26: qty 30, 30d supply, fill #0

## 2021-05-09 MED ORDER — ATORVASTATIN CALCIUM 40 MG PO TABS
40.0000 mg | ORAL_TABLET | Freq: Every day | ORAL | 1 refills | Status: DC
  Filled 2021-05-09: qty 90, 90d supply, fill #0

## 2021-05-09 NOTE — Assessment & Plan Note (Addendum)
Xarelto refilled and sent to pharmacy. Denies GI bleeding. If frequency of gross hematuria increases, would consider revisiting the risks and benefits

## 2021-05-09 NOTE — Assessment & Plan Note (Signed)
Current medications: amlodipine 10mg  daily, losartan 25mg  daily Endorses medication compliance. Denies adverse medication effects. Blood pressure is around goal in the office today.  Plan -BMP today -referral to healthy weight and wellness placed -continue current management -f/u 6 months

## 2021-05-09 NOTE — Assessment & Plan Note (Addendum)
TSH normal. Continue synthroid 

## 2021-05-09 NOTE — Assessment & Plan Note (Addendum)
Pt notes 2 incidences of finding a large volume of blood in the toliet after urinating. She is not sure if it was from her urethra or vagina. She notes that she did not have spotting or blood in her underwear prior to and after the event. Most recent event was last week. No suprapubic or back pain. No dysuria. Assessment: favor gross hematuria over abnormal uterine bleeding however Plan -will start with obtaining a UA -will be helpful to have the GYN evaluation of the lesion I noted on pelvic exam today -consider urology referral pending results of the above  Addendum 05/12/21  No RBC on UA. Raises suspicion for this to have been from a vaginal source.  -repeat UA in 3-6 months

## 2021-05-09 NOTE — Assessment & Plan Note (Signed)
This has been going on for over 1y however is not present at the office today. Hive appearing lesions have been occurring several times a week, and can last anywhere from to 1d. They occur on her arms, legs, back, neck and head and are immensely pruritic. No other symptoms, including shortness of breath or swelling occur. Only aggrevating factor she can correlate them with is coldness. No alleviating factors. No prior similar symptoms. Refer to the media tab for photos Plan -start antihistamine -referral to dermatology placed

## 2021-05-09 NOTE — Patient Instructions (Signed)
Rash -I referred you to dermatology for further evaluation -In the meantime, I would like you to try an antihistamine such as allegra  Weight loss -I am happy that you are motivated to lose weight. I have placed a referral to the [facility] clinic to assist you in this journey -I will send in one of the diabetes/weight loss medications, depending on what your A1C is. I will place a referral to our pharmacist, who can assist in education regarding administation  As we spoke about, I did find an abnormal lesion during your pelvic exam. I strongly recommend that you go see gynecology so

## 2021-05-09 NOTE — Assessment & Plan Note (Addendum)
Referred to healthy weight and wellness Ozempic started

## 2021-05-09 NOTE — Assessment & Plan Note (Addendum)
New onset DM: A1C is 7 at today's visit.  Given her desire for weight loss, in combination with glycemic control, I think ozempic would be a good choice for her. She denies personal or family history of thyroid cancers.  Plan -ozempic titration -referrals placed to clinical pharmacist and dietician for med education and dietary education -f/u in 3 months -foot exam and ophthalmology referral at next office visit

## 2021-05-09 NOTE — Assessment & Plan Note (Signed)
Tdap provided today. She is currently in the process of receiving shingles vaccination She has received COVID vaccination series with 1 booster Mammogram scheduled for 05/14/21 Pap smear performed today Referral placed for colonoscopy

## 2021-05-09 NOTE — Progress Notes (Addendum)
Office Visit   Patient ID: Isabel Chen, female    DOB: 07/23/1956, 65 y.o.   MRN: [id number]   PCP: Chevis Pretty, MD   Subjective:  CC: hypertension, hypothyroidism, urticaria, gross hematuria, cervical cancer screening, colon cancer screening  Isabel Chen is a 65 y.o. year old female who presents for follow up of the above. Please refer to problem based charting for assessment and plan.      ACTIVE MEDICATIONS   Outpatient Medications Prior to Visit  Medication Sig Dispense Refill   albuterol (VENTOLIN HFA) 108 (90 Base) MCG/ACT inhaler Inhale 2 puffs into the lungs every 6 (six) hours as needed for wheezing or shortness of breath. 18 g 5   amLODipine (NORVASC) 10 MG tablet TAKE 1 TABLET(10 MG) BY MOUTH DAILY 90 tablet 0   camphor-menthol (SARNA) lotion Apply 1 application topically as needed for itching. 222 mL 1   celecoxib (CELEBREX) 100 MG capsule TAKE 1 CAPSULE (100 MG TOTAL) BY MOUTH 2 (TWO) TIMES DAILY AS NEEDED. 30 capsule 0   famotidine (PEPCID) 20 MG tablet TAKE 1 TABLET BY MOUTH AT BEDTIME 90 tablet 0   levothyroxine (SYNTHROID) 50 MCG tablet TAKE 1 TABLET(50 MCG) BY MOUTH DAILY 90 tablet 1   losartan (COZAAR) 25 MG tablet TAKE 1 TABLET(25 MG) BY MOUTH DAILY 90 tablet 1   pantoprazole (PROTONIX) 40 MG tablet TAKE 1 TABLET(40 MG) BY MOUTH DAILY 30 TO 60 MINUTES BEFORE FIRST MEAL OF THE DAY 90 tablet 0   atorvastatin (LIPITOR) 40 MG tablet TAKE 1 TABLET BY MOUTH DAILY AT 6 PM 90 tablet 1   XARELTO 20 MG TABS tablet TAKE 1 TABLET BY MOUTH DAILY WITH SUPPER 90 tablet 1   No facility-administered medications prior to visit.     Objective:   BP 135/83 (BP Location: Left Arm, Patient Position: Sitting, Cuff Size: Large)   Pulse 80   Temp 97.7 F (36.5 C) (Oral)   Ht 5\' 10"  (1.778 m)   Wt (!) 330 lb 14.4 oz (150.1 kg)   SpO2 100%   BMI 47.48 kg/m  Wt Readings from Last 3 Encounters:  05/09/21 (!) 330 lb 14.4 oz (150.1 kg)  09/23/20 (!) 335 lb 12.8 oz  (152.3 kg)  06/10/20 (!) 325 lb 14.4 oz (147.8 kg)   BP Readings from Last 3 Encounters:  05/09/21 135/83  09/23/20 (!) 146/83  06/10/20 121/66   Physical Exam General: obese habitus, well appearing Pelvic: Chaperoned by 06/12/20, RN. normal external genitalia, no abnormal vaginal discharge. Small, vascular appearing, abnormally shaped mass near the cervix--non-bleeding. No cervical discharge. Pt tolerated the exam well.  Health Maintenance:   Health Maintenance  Topic Date Due   FOOT EXAM  Never done   OPHTHALMOLOGY EXAM  Never done   PAP SMEAR-Modifier  Never done   COLONOSCOPY (Pts 45-56yrs Insurance coverage will need to be confirmed)  Never done   MAMMOGRAM  03/11/2018   INFLUENZA VACCINE  06/23/2021   Zoster Vaccines- Shingrix (2 of 2) 06/23/2021   COVID-19 Vaccine (4 - Booster for Pfizer series) 08/13/2021   HEMOGLOBIN A1C  11/08/2021   TETANUS/TDAP  05/10/2031   Hepatitis C Screening  Completed   HIV Screening  Completed   Pneumococcal Vaccine 44-37 Years old  Aged Out   HPV VACCINES  Aged Out     Assessment & Plan:   Problem List Items Addressed This Visit       Cardiovascular and Mediastinum   DVT (deep venous thrombosis) (  HCC)    Xarelto refilled and sent to pharmacy. Denies GI bleeding. If frequency of gross hematuria increases, would consider revisiting the risks and benefits       Relevant Medications   rivaroxaban (XARELTO) 20 MG TABS tablet   atorvastatin (LIPITOR) 40 MG tablet   Hypertension - Primary    Current medications: amlodipine 10mg  daily, losartan 25mg  daily Endorses medication compliance. Denies adverse medication effects. Blood pressure is around goal in the office today.  Plan -BMP today -referral to healthy weight and wellness placed -continue current management -f/u 6 months       Relevant Medications   rivaroxaban (XARELTO) 20 MG TABS tablet   atorvastatin (LIPITOR) 40 MG tablet   Other Relevant Orders   Lipid panel  (Completed)   Basic metabolic panel (Completed)   POC Hbg A1C (Completed)     Endocrine   Hypothyroidism    TSH normal. Continue synthroid       Relevant Orders   TSH (Completed)   DM (diabetes mellitus), type 2 (HCC)    New onset DM: A1C is 7 at today's visit.  Given her desire for weight loss, in combination with glycemic control, I think ozempic would be a good choice for her. She denies personal or family history of thyroid cancers.  Plan -ozempic titration -referrals placed to clinical pharmacist and dietician for med education and dietary education -f/u in 3 months -foot exam and ophthalmology referral at next office visit       Relevant Medications   Semaglutide,0.25 or 0.5MG /DOS, (OZEMPIC, 0.25 OR 0.5 MG/DOSE,) 2 MG/1.5ML SOPN   atorvastatin (LIPITOR) 40 MG tablet   Other Relevant Orders   POC Hbg A1C (Completed)   Glucose, capillary (Completed)   Amb Referral to Clinical Pharmacist   Referral to Nutrition and Diabetes Services     Musculoskeletal and Integument   Urticaria    This has been going on for over 1y however is not present at the office today. Hive appearing lesions have been occurring several times a week, and can last anywhere from to 1d. They occur on her arms, legs, back, neck and head and are immensely pruritic. No other symptoms, including shortness of breath or swelling occur. Only aggrevating factor she can correlate them with is coldness. No alleviating factors. No prior similar symptoms. Refer to the media tab for photos Plan -start antihistamine -referral to dermatology placed       Relevant Orders   Ambulatory referral to Dermatology     Genitourinary   Gross hematuria    Pt notes 2 incidences of finding a large volume of blood in the toliet after urinating. She is not sure if it was from her urethra or vagina. She notes that she did not have spotting or blood in her underwear prior to and after the event. Most recent event was  last week. No suprapubic or back pain. No dysuria. Assessment: favor gross hematuria over abnormal uterine bleeding however Plan -will start with obtaining a UA -will be helpful to have the GYN evaluation of the lesion I noted on pelvic exam today -consider urology referral pending results of the above  Addendum 05/12/21  No RBC on UA. Raises suspicion for this to have been from a vaginal source.  -repeat UA in 3-6 months       Relevant Orders   Basic metabolic panel (Completed)   Urinalysis, Reflex Microscopic (Completed)   CBC with Diff (Completed)     Other  Morbid obesity due to excess calories (HCC) complicated by spont L DVT    Referred to healthy weight and wellness Ozempic started        Relevant Medications   Semaglutide,0.25 or 0.5MG /DOS, (OZEMPIC, 0.25 OR 0.5 MG/DOSE,) 2 MG/1.5ML SOPN   Other Relevant Orders   Amb Ref to Medical Weight Management   Healthcare maintenance    Tdap provided today. She is currently in the process of receiving shingles vaccination She has received COVID vaccination series with 1 booster Mammogram scheduled for 05/14/21 Pap smear performed today Referral placed for colonoscopy       Relevant Orders   Ambulatory referral to Gastroenterology   Tdap vaccine greater than or equal to 7yo IM (Completed)   Hepatitis C antibody (Completed)   Cytology -Pap Smear   Cervical mass    Pt presented for regular cervical cancer screening with pap smear today. Last pap smear was over a decade ago.  During the exam, I encountered a small, vascular appearing mass near the cervix. Findings discussed at her visit today Plan -referral placed to GYN for further evaluation and management -pap smear collected       Relevant Orders   Ambulatory referral to Gynecology   Other Visit Diagnoses     Colon cancer screening       Relevant Orders   Ambulatory referral to Gastroenterology   Encounter for HCV screening test for high risk patient        Relevant Orders   Hepatitis C antibody (Completed)   Cervical cancer screening       Relevant Orders   Cytology -Pap Smear   Metabolic syndrome       Relevant Orders   Lipid panel (Completed)   Amb Ref to Medical Weight Management      All lab results communicated to the patient.  F/u in 3 months for diabetes recheck.   Pt discussed with Dr. Rejeana Brock, MD Internal Medicine Resident PGY-2 [facility] Pager: [phone] 05/12/2021 8:51 AM    f

## 2021-05-09 NOTE — Assessment & Plan Note (Signed)
Pt presented for regular cervical cancer screening with pap smear today. Last pap smear was over a decade ago.  During the exam, I encountered a small, vascular appearing mass near the cervix. Findings discussed at her visit today Plan -referral placed to GYN for further evaluation and management -pap smear collected

## 2021-05-10 LAB — LIPID PANEL
Chol/HDL Ratio: 2.9 ratio (ref 0.0–4.4)
Cholesterol, Total: 172 mg/dL (ref 100–199)
HDL: 60 mg/dL (ref >39)
LDL Chol Calc (NIH): 94 mg/dL (ref 0–99)
Triglycerides: 98 mg/dL (ref 0–149)
VLDL Cholesterol Cal: 18 mg/dL (ref 5–40)

## 2021-05-10 LAB — BASIC METABOLIC PANEL
BUN/Creatinine Ratio: 14 (ref 12–28)
BUN: 13 mg/dL (ref 8–27)
CO2: 23 mmol/L (ref 20–29)
Calcium: 9.6 mg/dL (ref 8.7–10.3)
Chloride: 103 mmol/L (ref 96–106)
Creatinine, Ser: 0.92 mg/dL (ref 0.57–1.00)
Glucose: 121 mg/dL — ABNORMAL HIGH (ref 65–99)
Potassium: 4.2 mmol/L (ref 3.5–5.2)
Sodium: 142 mmol/L (ref 134–144)
eGFR: 70 mL/min/{1.73_m2} (ref >59)

## 2021-05-10 LAB — URINALYSIS, ROUTINE W REFLEX MICROSCOPIC
Bilirubin, UA: NEGATIVE
Glucose, UA: NEGATIVE
Ketones, UA: NEGATIVE
Leukocytes,UA: NEGATIVE
Nitrite, UA: NEGATIVE
Protein,UA: NEGATIVE
RBC, UA: NEGATIVE
Specific Gravity, UA: 1.011 (ref 1.005–1.030)
Urobilinogen, Ur: 0.2 mg/dL (ref 0.2–1.0)
pH, UA: 7 (ref 5.0–7.5)

## 2021-05-10 LAB — CBC WITH DIFFERENTIAL/PLATELET
Basophils Absolute: 0 10*3/uL (ref 0.0–0.2)
Basos: 0 %
EOS (ABSOLUTE): 0.3 10*3/uL (ref 0.0–0.4)
Eos: 5 %
Hematocrit: 36 % (ref 34.0–46.6)
Hemoglobin: 12.1 g/dL (ref 11.1–15.9)
Immature Grans (Abs): 0.1 10*3/uL (ref 0.0–0.1)
Immature Granulocytes: 1 %
Lymphocytes Absolute: 2.2 10*3/uL (ref 0.7–3.1)
Lymphs: 31 %
MCH: 27 pg (ref 26.6–33.0)
MCHC: 33.6 g/dL (ref 31.5–35.7)
MCV: 80 fL (ref 79–97)
Monocytes Absolute: 0.4 10*3/uL (ref 0.1–0.9)
Monocytes: 5 %
Neutrophils Absolute: 4.3 10*3/uL (ref 1.4–7.0)
Neutrophils: 58 %
Platelets: 386 10*3/uL (ref 150–450)
RBC: 4.48 x10E6/uL (ref 3.77–5.28)
RDW: 15.8 % — ABNORMAL HIGH (ref 11.7–15.4)
WBC: 7.3 10*3/uL (ref 3.4–10.8)

## 2021-05-10 LAB — HEPATITIS C ANTIBODY: Hep C Virus Ab: <0.1 s/co ratio (ref 0.0–0.9)

## 2021-05-10 LAB — TSH: TSH: 3.02 u[IU]/mL (ref 0.450–4.500)

## 2021-05-12 ENCOUNTER — Other Ambulatory Visit (HOSPITAL_COMMUNITY): Payer: Self-pay

## 2021-05-12 LAB — CYTOLOGY - PAP
Adequacy: ABSENT
Comment: NEGATIVE
Diagnosis: NEGATIVE
High risk HPV: NEGATIVE

## 2021-05-14 ENCOUNTER — Other Ambulatory Visit: Payer: Self-pay

## 2021-05-14 ENCOUNTER — Ambulatory Visit
Admission: RE | Admit: 2021-05-14 | Discharge: 2021-05-14 | Disposition: A | Discharge status: Home / Self Care | Payer: 59 | Source: Ambulatory Visit | Attending: Internal Medicine | Admitting: Internal Medicine

## 2021-05-14 DIAGNOSIS — Z1231 Encounter for screening mammogram for malignant neoplasm of breast: Secondary | ICD-10-CM

## 2021-05-19 ENCOUNTER — Encounter: Payer: Self-pay | Admitting: Gastroenterology

## 2021-05-27 ENCOUNTER — Encounter: Payer: Self-pay | Admitting: *Deleted

## 2021-06-11 ENCOUNTER — Telehealth: Payer: Self-pay | Admitting: *Deleted

## 2021-06-11 NOTE — Telephone Encounter (Signed)
Called patient about her ozempic . She left it in the refrigerator of her truck and the truck was off and the refrigerator stopped working. She is unsure how long it was not refrigerated. She has used 4 weeks and is going up to 0.5 and only has 2 more left in the pen. Since Ozempic can stay room temp for 56 days after opening, I advised her to use it as directed for the next 2 weeks and monitor herself for signs and symptoms of high blood sugar. We discussed self monitoring her blood sugar and she  agreed to discuss self monitoring in more detail at her visit for Diabetes Self Management Education & Support on 06/23/21.

## 2021-06-11 NOTE — Telephone Encounter (Signed)
Patient called in stating her Ozempic is supposed to be refrigerated. States her refrigerator "conked out" and now medicine is warm. Wants to know if it is ok for her to take it. Will forward to Pharm and Lupita Leash to advise.

## 2021-06-13 ENCOUNTER — Encounter: Payer: Self-pay | Admitting: Internal Medicine

## 2021-06-23 ENCOUNTER — Encounter: Payer: 59 | Admitting: Dietician

## 2021-06-24 ENCOUNTER — Other Ambulatory Visit (HOSPITAL_COMMUNITY): Payer: Self-pay

## 2021-06-24 ENCOUNTER — Encounter: Payer: Self-pay | Admitting: Family Medicine

## 2021-06-24 ENCOUNTER — Encounter: Payer: Self-pay | Admitting: Gastroenterology

## 2021-06-24 ENCOUNTER — Other Ambulatory Visit: Payer: Self-pay | Admitting: Internal Medicine

## 2021-06-24 ENCOUNTER — Other Ambulatory Visit: Payer: Self-pay | Admitting: Student

## 2021-06-24 ENCOUNTER — Other Ambulatory Visit: Payer: Self-pay

## 2021-06-24 ENCOUNTER — Ambulatory Visit (INDEPENDENT_AMBULATORY_CARE_PROVIDER_SITE_OTHER): Payer: 59 | Admitting: Gastroenterology

## 2021-06-24 ENCOUNTER — Telehealth: Payer: Self-pay

## 2021-06-24 ENCOUNTER — Ambulatory Visit (INDEPENDENT_AMBULATORY_CARE_PROVIDER_SITE_OTHER): Payer: 59 | Admitting: Family Medicine

## 2021-06-24 VITALS — BP 110/70 | HR 83 | Ht 70.0 in | Wt 325.0 lb

## 2021-06-24 DIAGNOSIS — Z1211 Encounter for screening for malignant neoplasm of colon: Secondary | ICD-10-CM | POA: Diagnosis not present

## 2021-06-24 DIAGNOSIS — K219 Gastro-esophageal reflux disease without esophagitis: Secondary | ICD-10-CM

## 2021-06-24 DIAGNOSIS — Z7901 Long term (current) use of anticoagulants: Secondary | ICD-10-CM | POA: Insufficient documentation

## 2021-06-24 DIAGNOSIS — N888 Other specified noninflammatory disorders of cervix uteri: Secondary | ICD-10-CM

## 2021-06-24 DIAGNOSIS — I10 Essential (primary) hypertension: Secondary | ICD-10-CM

## 2021-06-24 MED ORDER — PEG-KCL-NACL-NASULF-NA ASC-C 100 G PO SOLR
1.0000 | Freq: Once | ORAL | 0 refills | Status: DC
  Filled 2021-06-24: qty 1, 1d supply, fill #0

## 2021-06-24 MED ORDER — PEG-KCL-NACL-NASULF-NA ASC-C 100 G PO SOLR: 1.0000 | Freq: Once | ORAL | 0 refills | Status: AC

## 2021-06-24 NOTE — Patient Instructions (Addendum)
It was my pleasure to provide care to you today. Based on our discussion, I am providing you with my recommendations below:  RECOMMENDATION(S):   You have been scheduled for a colonoscopy. Please follow written instructions given to you at your visit today.   PREP:   Please pick up your prep supplies at the pharmacy within the next 1-3 days.  INHALERS:   If you use inhalers (even only as needed), please bring them with you on the day of your procedure.  MEDICATIONS TO HOLD:  We will contact your provider to request permission for you to hold XARELTO. Once we receive a response, you will be contacted by our office. If you do not hear from our office 1 week prior to your scheduled procedure, please contact our office at [phone].  Please refer to your Prep Instructions regarding holding your Ozmepic  COLONOSCOPY TIPS:  To reduce nausea and dehydration, stay well hydrated for 3-4 days prior to the exam.  To prevent skin/hemorrhoid irritation - prior to wiping, put A&Dointment or vaseline on the toilet paper. Keep a towel or pad on the bed.  BEFORE STARTING YOUR PREP, drink  64oz of clear liquids in the morning. This will help to flush the colon and will ensure you are well hydrated!!!!  NOTE - This is in addition to the fluids required for to complete your prep. Use of a flavored hard candy, such as grape Rubin Payor, can counteract some of the flavor of the prep and may prevent some nausea.   FOLLOW UP:  After your procedure, you will receive a call from my office staff regarding my recommendation for follow up.  BMI:  If you are age 29 or older, your body mass index should be between 23-30. Your There is no height or weight on file to calculate BMI. If this is out of the aforementioned range listed, please consider follow up with your Primary Care Provider.  MY CHART:  The [place] [facility] providers would like to encourage you to use [facility] to communicate with providers for  non-urgent requests or questions.  Due to long hold times on the telephone, sending your provider a message by [facility] may be a faster and more efficient way to get a response.  Please allow 48 business hours for a response.  Please remember that this is for non-urgent requests.   Thank you for trusting me with your gastrointestinal care!    Doug Sou, PA-C

## 2021-06-24 NOTE — Progress Notes (Signed)
   Subjective:    Patient ID: Isabel Chen is a 65 y.o. female presenting with Gynecologic Exam  on 06/24/2021  HPI: Patient is s/p TAH with USO in the 1980s for fibroids. She had SVD x 4. Told she did not have a cervix and did not need further evaluations for cervical cancer. She found in her MyChart she was due.  Review of Systems  Constitutional:  Negative for chills and fever.  Respiratory:  Negative for shortness of breath.   Cardiovascular:  Negative for chest pain.  Gastrointestinal:  Negative for abdominal pain, nausea and vomiting.  Genitourinary:  Negative for dysuria.  Skin:  Negative for rash.     Objective:    BP (!) 161/90   Pulse 92   Wt (!) 326 lb (147.9 kg)   BMI 46.78 kg/m  Physical Exam Constitutional:      General: She is not in acute distress.    Appearance: She is well-developed.  HENT:     Head: Normocephalic and atraumatic.  Eyes:     General: No scleral icterus. Cardiovascular:     Rate and Rhythm: Normal rate.  Pulmonary:     Effort: Pulmonary effort is normal.  Abdominal:     Palpations: Abdomen is soft.  Genitourinary:    Comments: Vaginal cuff is clear. One can see a few blood vessels behing the cuff. On bimanual there is no cervix or uterus.  Musculoskeletal:     Cervical back: Neck supple.  Skin:    General: Skin is warm and dry.  Neurological:     Mental Status: She is alert and oriented to person, place, and time.        Assessment & Plan:   Problem List Items Addressed This Visit       Unprioritized   Cervical mass    There is no cervix on exam and no vaginal mass.        Return if symptoms worsen or fail to improve.  Reva Bores 06/25/2021 8:42 AM

## 2021-06-24 NOTE — Progress Notes (Signed)
06/24/2021 Isabel Chen [id number] 1956/08/14   HISTORY OF PRESENT ILLNESS: This is a 65 year old female who is new to our office.  She has been referred here by her PCP in order to discuss a colonoscopy.  She has never had one in the past.  She is on Xarelto for history of DVT in 2019.  She denies any GI complaints.  Moves her bowels regularly.  Has seen a small amount of bright red rectal bleeding twice in the past 2 years.  Past Medical History:  Diagnosis Date   Arthritis    GERD (gastroesophageal reflux disease)    Heart murmur yrs ago   History of bronchitis last 2016   Pneumonia 2010   Thyroid disease    Past Surgical History:  Procedure Laterality Date   ABDOMINAL HYSTERECTOMY     partial   APPENDECTOMY  yrs ago   with exploratory surgery   BUNIONECTOMY Right    exploratory surgery  yrs ago   HIP SURGERY Right    for bursa   ROTATOR CUFF REPAIR Right    THYROID LOBECTOMY Left 06/25/2016   Procedure: LEFT THYROID LOBECTOMY;  Surgeon: Darnell Level, MD;  Location: [facility];  Service: General;  Laterality: Left;    reports that she quit smoking about 37 years ago. Her smoking use included cigarettes. She has a 2.50 pack-year smoking history. She has never used smokeless tobacco. She reports current alcohol use. She reports that she does not use drugs. family history includes Cancer in her maternal grandmother; Diabetes in her mother; Hypertension in her mother; Stroke in her father. Allergies  Allergen Reactions   Tetracyclines & Related Hives      Outpatient Encounter Medications as of 06/24/2021  Medication Sig   amLODipine (NORVASC) 10 MG tablet TAKE 1 TABLET(10 MG) BY MOUTH DAILY   atorvastatin (LIPITOR) 40 MG tablet Take 1 tablet (40 mg total) by mouth daily at 6 PM.   camphor-menthol (SARNA) lotion Apply 1 application topically as needed for itching.   celecoxib (CELEBREX) 100 MG capsule TAKE 1 CAPSULE (100 MG TOTAL) BY MOUTH 2 (TWO) TIMES DAILY AS NEEDED.    famotidine (PEPCID) 20 MG tablet TAKE 1 TABLET BY MOUTH AT BEDTIME   levothyroxine (SYNTHROID) 50 MCG tablet TAKE 1 TABLET(50 MCG) BY MOUTH DAILY   losartan (COZAAR) 25 MG tablet TAKE 1 TABLET(25 MG) BY MOUTH DAILY   pantoprazole (PROTONIX) 40 MG tablet TAKE 1 TABLET(40 MG) BY MOUTH DAILY 30 TO 60 MINUTES BEFORE FIRST MEAL OF THE DAY   rivaroxaban (XARELTO) 20 MG TABS tablet Take 1 tablet (20 mg total) by mouth daily with supper.   Semaglutide,0.25 or 0.5MG /DOS, (OZEMPIC, 0.25 OR 0.5 MG/DOSE,) 2 MG/1.5ML SOPN Inject 0.25mg  weekly for the first 4 weeks, then increase to 0.5mg  for the next four weeks, followed by 1mg  thereafter   [DISCONTINUED] albuterol (VENTOLIN HFA) 108 (90 Base) MCG/ACT inhaler Inhale 2 puffs into the lungs every 6 (six) hours as needed for wheezing or shortness of breath.   No facility-administered encounter medications on file as of 06/24/2021.     REVIEW OF SYSTEMS  : All other systems reviewed and negative except where noted in the History of Present Illness.   PHYSICAL EXAM: BP 110/70   Pulse 83   Ht 5\' 10"  (1.778 m)   Wt (!) 325 lb (147.4 kg)   BMI 46.63 kg/m  General: Well developed AA female in no acute distress Head: Normocephalic and atraumatic Eyes:  Sclerae anicteric, conjunctiva pink. Ears: Normal auditory acuity  Lungs: Clear throughout to auscultation; no W/R/R. Heart: Regular rate and rhythm; no M/R/G. Abdomen: Soft, non-distended.  BS present.  Non-tender. Rectal:  Will be done at the time of colonoscopy. Musculoskeletal: Symmetrical with no gross deformities  Skin: No lesions on visible extremities Extremities: No edema  Neurological: Alert oriented x 4, grossly non-focal Psychological:  Alert and cooperative. Normal mood and affect  ASSESSMENT AND PLAN: *CRC screening:  Never had colonoscopy in the past.  Will schedule with Dr. Tomasa Rand. *Chronic anticoagulation:  On Xarelto for history of DVT in 2019.  Will hold Xarelto  one day prior to  endoscopic procedures - will instruct when and how to resume after procedure. Benefits and risks of procedure explained including risks of bleeding, perforation, infection, missed lesions, reactions to medications and possible need for hospitalization and surgery for complications. Additional rare but real risk of stroke or other vascular clotting events off Xarelto also explained and need to seek urgent help if any signs of these problems occur. Will communicate by phone or EMR with patient's prescribing provider to confirm that holding Xarelto is reasonable in this case.    CC:  [place]i, Technical brewer, *

## 2021-06-24 NOTE — Progress Notes (Signed)
Agree with the assessment and plan as outlined by Jessica Zehr, PA-C.  Lennie Vasco E. Tequilla Cousineau, MD  [facility]  

## 2021-06-24 NOTE — Telephone Encounter (Signed)
Pre-operative Risk Assessment     Isabel Chen 1956/03/23 [id number]  Procedure: Colonoscopy Anesthesia type:  MAC Procedure Date: Colonoscopy Provider: Dr. Candis Schatz  Type of Clearance needed: Pharmacy  Medication(s) needing held: Xarelto   Length of time for medication to be held: 1 day  Please review request and advise by either responding to this message or by sending your response to the fax # provided below.  Thank you,  [facility]  Phone: [phone] Fax: [phone] ATTENTION: Lacey Dotson, LPN

## 2021-06-25 ENCOUNTER — Encounter: Payer: Self-pay | Admitting: Family Medicine

## 2021-06-25 ENCOUNTER — Other Ambulatory Visit (HOSPITAL_COMMUNITY): Payer: Self-pay

## 2021-06-25 ENCOUNTER — Ambulatory Visit (INDEPENDENT_AMBULATORY_CARE_PROVIDER_SITE_OTHER): Payer: 59 | Admitting: Dietician

## 2021-06-25 ENCOUNTER — Ambulatory Visit (INDEPENDENT_AMBULATORY_CARE_PROVIDER_SITE_OTHER): Payer: 59 | Admitting: Pharmacist

## 2021-06-25 ENCOUNTER — Encounter: Payer: Self-pay | Admitting: Dietician

## 2021-06-25 DIAGNOSIS — E119 Type 2 diabetes mellitus without complications: Secondary | ICD-10-CM

## 2021-06-25 DIAGNOSIS — Z713 Dietary counseling and surveillance: Secondary | ICD-10-CM

## 2021-06-25 LAB — GLUCOSE, CAPILLARY: Glucose-Capillary: 111 mg/dL — ABNORMAL HIGH (ref 70–99)

## 2021-06-25 MED ORDER — ONETOUCH VERIO VI STRP
ORAL_STRIP | 0 refills | Status: DC
  Filled 2021-06-25: qty 50, 50d supply, fill #0

## 2021-06-25 MED ORDER — ONETOUCH DELICA PLUS LANCET33G MISC
0 refills | Status: DC
  Filled 2021-06-25: qty 100, 90d supply, fill #0

## 2021-06-25 MED ORDER — FREESTYLE LITE W/DEVICE KIT
1.0000 "application " | PACK | Freq: Every day | 0 refills | Status: AC
  Filled 2021-06-25: qty 1, 1d supply, fill #0

## 2021-06-25 MED ORDER — ONETOUCH VERIO FLEX SYSTEM W/DEVICE KIT
PACK | 0 refills | Status: DC
  Filled 2021-06-25: qty 1, 1d supply, fill #0

## 2021-06-25 MED ORDER — FREESTYLE LITE TEST VI STRP
ORAL_STRIP | 0 refills | Status: DC
  Filled 2021-06-25: qty 50, 50d supply, fill #0
  Filled 2022-03-30 – 2022-04-06 (×2): qty 50, 50d supply, fill #1

## 2021-06-25 MED ORDER — FREESTYLE LANCETS MISC
0 refills | Status: DC
  Filled 2021-06-25: qty 100, 90d supply, fill #0

## 2021-06-25 NOTE — Patient Instructions (Signed)
Isabel Chen it was a pleasure seeing you today.   Please do the following:  Start taking Ozempic again at 0.25mg  on Sundays as directed today during your appointment. If you have any questions or if you believe something has occurred because of this change, call me or your doctor to let one of [place] know.  Continue checking blood sugars at home. It's really important that you record these and bring these in to your next doctor's appointment.  Continue making the lifestyle changes we've discussed together during our visit. Diet and exercise play a significant role in improving your blood sugars.  Follow-up with me in four weeks.  Hypoglycemia or low blood sugar:   Low blood sugar can happen quickly and may become an emergency if not treated right away.   While this shouldn't happen often, it can be brought upon if you skip a meal or do not eat enough. Also, if your insulin or other diabetes medications are dosed too high, this can cause your blood sugar to go to low.   Warning signs of low blood sugar include: Feeling shaky or dizzy Feeling weak or tired  Excessive hunger Feeling anxious or upset  Sweating even when you aren't exercising  What to do if I experience low blood sugar? Follow the Rule of 15 Check your blood sugar with your meter. If lower than 70, proceed to step 2.  Treat with 15 grams of fast acting carbs which is found in 3-4 glucose tablets. If none are available you can try hard candy, 1 tablespoon of sugar or honey,4 ounces of fruit juice, or 6 ounces of REGULAR soda.  Re-check your sugar in 15 minutes. If it is still below 70, do what you did in step 2 again. If your blood sugar has come back up, go ahead and eat a snack or small meal made up of complex carbs (ex. Whole grains) and protein at this time to avoid recurrence of low blood sugar.

## 2021-06-25 NOTE — Progress Notes (Signed)
   Subjective:    Patient ID: Isabel Chen, female    DOB: [birth year], 65 y.o.   MRN: [id number]  HPI Patient is a 65 y.o. female who presents for diabetes management. She is in good spirits and presents with assistance. Patient was referred and last seen by Primary Care Provider on 05/09/21.  Patient reports diabetes was diagnosed about two months ago at last appointment with PCP.  Insurance coverage/medication affordability: [employer]  Current diabetes medications include: Ozempic 0.25mg   Current hypertension medications include: amlodipine 10mg , losartan 25mg  Current hyperlipidemia medications include: atorvastatin 40mg  Patient states that She is taking her medications as prescribed. Patient reports adherence with medications but has not taken Ozempic in two weeks. Patient reports when she was taking it she was tolerating it well with no nausea or vomiting.  Patient denies hypoglycemic events. Patient denies polyuria (increased urination).  Patient denies polyphagia (increased appetite).  Patient denies polydipsia (increased thirst).  Patient reports neuropathy (nerve pain) in feet but states this is ongoing Patient denies visual changes. Patient denies self foot exams.   Patient does not have a glucometer and has not been checking her blood glucose.    Objective:   Labs:   Physical Exam Constitutional:      Appearance: She is obese.  Neurological:     Mental Status: She is alert and oriented to person, place, and time.    Review of Systems  Gastrointestinal:  Negative for abdominal pain, nausea and vomiting.   Lab Results  Component Value Date   HGBA1C 7.0 (A) 05/09/2021   HGBA1C 6.3 (H) 10/30/2018   Lipid Panel     Component Value Date/Time   CHOL 172 05/09/2021 1137   TRIG 98 05/09/2021 1137   HDL 60 05/09/2021 1137   CHOLHDL 2.9 05/09/2021 1137   CHOLHDL 4.5 10/30/2018 0451   VLDL 20 10/30/2018 0451   LDLCALC 94 05/09/2021 1137    Assessment/Plan:    T2DM control unable to be properly assessed due to patient not currently checking blood glucose. Medication adherence appears optimal. Will re-start patient on Ozempic 0.25mg  once weekly. Patient educated on purpose, proper use and potential adverse effects of Ozempic.  Following instruction patient verbalized understanding of treatment plan.    Restarted GLP-1 semaglutide (Ozempic) 0.25mg  once weekly on Sundays.  Extensively discussed pathophysiology of diabetes, dietary effects on blood sugar control, and recommended lifestyle interventions.  Counseled on s/sx of and management of hypoglycemia Sent in prescription for glucometer supplies. Recommended patient check fasting blood glucose daily Next A1C anticipated September 2022.   Follow-up appointment in four weeks to review sugar readings. Written patient instructions provided.  This appointment required 30 minutes of direct patient care.  Thank you for involving pharmacy to assist in providing this patient's care.

## 2021-06-25 NOTE — Telephone Encounter (Signed)
SECOND REQUEST:  Chart Review Routing History Since 06/26/2020 (View Full Routing History)  Recipients Sent On Sent By Routed Reports   ATTENTION: MAY   06/25/2021  3:56 PM Isabel Pilling, LPN Telephone on 06/24/2021 with Isabel Pilling, LPN      Cover Page Message : URGENT:

## 2021-06-25 NOTE — Assessment & Plan Note (Signed)
There is no cervix on exam and no vaginal mass.

## 2021-06-25 NOTE — Progress Notes (Signed)
Medical Nutrition Therapy:  Appt start time: 0930 end time:  1018. Total time: 48 Visit # 1  Assessment:  Primary concerns today: putting her diabetes in remission. Patient was recently diagnosed with diabetes.  Her goal is to weigh eventually 165-190#. She would like to lose it at a reasonable rate so she can maintain it.  She states she wants a Program and to stick with it to help her decrease her weight and keep it down. She wants to see her 82 month old granddaughter grow up Always fasts during lent. She achieves a 30-50# weight loss, but then always regains it. She states her "Biggest problem is sweets.". 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 She is on the road months at a time. She has a grill and refrigerator in her truck, but often does not feel she has the time to clean it up and this keeps her from cooking more often. She eats at truck stops, [employer], [place] and [place].  She understands basics of calories, food groups and portions. Preferred Learning Style: No preference indicated  Learning Readiness: Ready and Change in progress  ANTHROPOMETRICS: Estimated body mass index is 46.78 kg/m as calculated from the following:   Height as of 06/24/21: 5\' 10"  (1.778 m).   Weight as of 06/24/21: 326 lb (147.9 kg).  WEIGHT HISTORY:  Wt Readings from Last 10 Encounters:  06/24/21 (!) 326 lb (147.9 kg)  06/24/21 (!) 325 lb (147.4 kg)  05/09/21 (!) 330 lb 14.4 oz (150.1 kg)  09/23/20 (!) 335 lb 12.8 oz (152.3 kg)  06/10/20 (!) 325 lb 14.4 oz (147.8 kg)  03/12/20 (!) 323 lb 12.8 oz (146.9 kg)  01/31/20 (!) 335 lb (152 kg)  12/28/19 (!) 335 lb 14.4 oz (152.4 kg)  10/18/19 (!) 360 lb 1.6 oz (163.3 kg)  09/25/19 (!) 335 lb (152 kg)   SLEEP:we did not discuss this in detail, but she frowned when I referred to sleeping patterns  being a possible barrier to achieving her goals  MEDICATIONS: ozempic weekly BLOOD SUGAR:111 fasting today; she does not  like her finger stuck, may reconsider if needed Lab Results  Component Value Date   HGBA1C 7.0 (A) 05/09/2021   HGBA1C 6.3 (H) 10/30/2018    DIETARY INTAKE: Usual eating pattern includes 3 meals and ?  snacks per day. Everyday foods include salmon, steak, old fashioned oatmeal, fish, liver, chicken, soup.  Dining Out (times/week): ~ 15/21 meals a week 24-hr recall:  B ( AM): ~1 cup home-cooked oatmeal with brown sugar, nuts, milk and country crock- sometimes a banana or cranberries L ( PM): chicken wrap with salad D ( PM): large portion of salmon, biscuits or corn Beverages: water,coke zero, peach green tea  Usual physical activity: recently bought and exercise machine for her legs  Estimated daily energy needs: for weight loss In order to maintain current weight:   2,535 Calories/day To reach goal of 300 lbs in 180 days:  1,777  Calories/day   Progress Towards Goal(s):  In progress.   Nutritional Diagnosis:  NI-1.5 Excessive energy intake As related to obesity and diabetes diagnoses.  As evidenced by her weight of 327 and BMI of 46.78.    Intervention:  Nutrition education about diabetes pathophysiology, hyperglycemia causes, symptoms and treatment, self monitoring of blood sugar and weight. Diabetes meal planning for weight loss Action Goal:record food and beverage intake for next month  Outcome goal: increased knowledge of  how to eat balanced and nutritious meals to control blood sugar , hunger and provide weight loss Coordination of care: none at this time  Teaching Method Utilized: Visual, Auditory,Hands on Handouts given during visit include:meal planning and carbohydrate counting for diabetes, how to add me as a friend to her myfitnesspal account, hyperglycemia Barriers to learning/adherence to lifestyle change: competing values Demonstrated degree of understanding via:  Teach Back   Monitoring/Evaluation:  Dietary intake, exercise, food records, and body weight in 4  week(s). By telehealth Norm Parcel, RD 06/25/2021 11:27 AM.

## 2021-06-25 NOTE — Assessment & Plan Note (Signed)
T2DM control unable to be properly assessed due to patient not currently checking blood glucose. Medication adherence appears optimal. Will re-start patient on Ozempic 0.25mg  once weekly. Patient educated on purpose, proper use and potential adverse effects of Ozempic.  Following instruction patient verbalized understanding of treatment plan.    1. Restarted GLP-1 semaglutide (Ozempic) 0.25mg  once weekly on Sundays.  2. Extensively discussed pathophysiology of diabetes, dietary effects on blood sugar control, and recommended lifestyle interventions.  3. Counseled on s/sx of and management of hypoglycemia 4. Sent in prescription for glucometer supplies. Recommended patient check fasting blood glucose daily 5. Next A1C anticipated September 2022.   Follow-up appointment in four weeks to review sugar readings. Written patient instructions provided.

## 2021-06-25 NOTE — Patient Instructions (Addendum)
We'll follow up by phone in one month.   You can record your food and beverage intake using [email or web address], sign up and this sets your daily goals goals for weight loss, etc then can email to me before visits.  If we cannot figure out how to email you can open it while we are on the phone and tell me what it says or you can send me your logon info.    You can record on paper as well.   Lupita Leash [phone]

## 2021-06-26 ENCOUNTER — Other Ambulatory Visit (HOSPITAL_COMMUNITY): Payer: Self-pay

## 2021-06-26 NOTE — Telephone Encounter (Signed)
THIRD REQUEST:  Called Dr. Luciana Axe office to f/u on status of clearance. Still insists they have not received request DESPITE faxing x3 with confirmation. Again re-faxed to [phone]. Confirmation received.  Chart Review Routing History Since 06/27/2020 [facility] Full Routing History)  Recipients Sent On Sent By Routed Reports   Attention May   06/26/2021  2:03 PM Deon Pilling, LPN Telephone on 06/24/2021 with Deon Pilling, LPN       ATTENTION: MAY   06/25/2021  3:56 PM Deon Pilling, LPN Telephone on 06/24/2021 with Deon Pilling, LPN      Cover Page Message : URGENT:

## 2021-06-27 ENCOUNTER — Other Ambulatory Visit (HOSPITAL_COMMUNITY): Payer: Self-pay

## 2021-06-27 NOTE — Telephone Encounter (Signed)
No further attempts required at this time to obtain clearance. Will request again once pt has chosen to move forward with procedure.  Received notification that pt has chosen to cancel procedure as below:  Appointment Information  Name: Isabel Chen, Isabel Chen MRN: [id number]  Date: 07/03/2021 Status: Can  Time: 10:30 AM Length: 30  Visit Type: PROPOFOL COLON [1272] Copay: $0.00  Provider: Jenel Lucks, MD Department: [facility]  Referring Provider: Carmel Sacramento CSN: [id number]  Notes: Colon cancer screening/ae  Rescheduled: Canceled: 06/24/2021 11:04 AM 06/26/2021 3:52 PM By: By: Jodelle Red HENDRICKS, TANACIA L  Cancel Rsn: Patient (unable to get off work)   Pt also canceled as below:  Appointment Information  Name: Isabel Chen, Isabel Chen MRN: [id number]  Date: 08/08/2021 Status: Can  Time: 3:30 PM Length: 30  Visit Type: PROPOFOL COLON [1272] Copay: $0.00  Provider: Jenel Lucks, MD Department: [facility]  Referring Provider: Carmel Sacramento CSN: [id number]  Notes: Colon cancer screening/ae  Made On: Canceled: 06/24/2021 10:14 AM 06/24/2021 11:04 AM By: By: Peterson Ao, Jensine Luz J HENDRICKS, TANACIA L  Cancel Rsn: Patient

## 2021-06-30 ENCOUNTER — Other Ambulatory Visit (HOSPITAL_COMMUNITY): Payer: Self-pay

## 2021-06-30 ENCOUNTER — Other Ambulatory Visit: Payer: Self-pay

## 2021-06-30 DIAGNOSIS — I82452 Acute embolism and thrombosis of left peroneal vein: Secondary | ICD-10-CM

## 2021-06-30 MED ORDER — RIVAROXABAN 20 MG PO TABS
20.0000 mg | ORAL_TABLET | Freq: Every day | ORAL | 1 refills | Status: DC
Start: 2021-06-30 — End: 2022-03-30

## 2021-06-30 NOTE — Telephone Encounter (Signed)
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 And [facility] pharmacy will not give her a 90 day supply. Thanks

## 2021-06-30 NOTE — Telephone Encounter (Signed)
Requesting to speak with a nurse about rivaroxaban (XARELTO) 20 MG TABS tablet, please call pt back.

## 2021-07-03 ENCOUNTER — Encounter: Payer: 59 | Admitting: Gastroenterology

## 2021-07-11 ENCOUNTER — Encounter: Payer: Self-pay | Admitting: Gastroenterology

## 2021-07-11 ENCOUNTER — Telehealth: Payer: Self-pay

## 2021-07-11 NOTE — Telephone Encounter (Signed)
Pre-operative Risk Assessment     Isabel Chen 20-Feb-1956 [id number]  Procedure: Colonoscopy Anesthesia type:  MAC Procedure Date: 09/26/21 Provider: Dr. Candis Schatz  Type of Clearance needed: Pharmacy  Medication(s) needing held: Xarelto   Length of time for medication to be held: 1 day  Please review request and advise by either responding to this message or by sending your response to the fax # provided below.  Thank you,  [facility]  Phone: [phone] Fax: [phone] ATTENTION: Hersh Minney, LPN

## 2021-07-14 NOTE — Telephone Encounter (Signed)
Next Appt With Gastroenterology ([facility] Previsit)09/12/2021 at 10:00 AM

## 2021-07-22 ENCOUNTER — Telehealth: Payer: 59 | Admitting: Pharmacist

## 2021-07-29 ENCOUNTER — Ambulatory Visit (INDEPENDENT_AMBULATORY_CARE_PROVIDER_SITE_OTHER): Payer: 59 | Admitting: Dietician

## 2021-07-29 ENCOUNTER — Other Ambulatory Visit: Payer: Self-pay

## 2021-07-29 DIAGNOSIS — E119 Type 2 diabetes mellitus without complications: Secondary | ICD-10-CM

## 2021-07-29 NOTE — Progress Notes (Signed)
Medical Nutrition Therapy:   Left voicemail for return call Norm Parcel, RD 07/29/2021 8:21 AM.   Appt start time: 0905 am   End time: 940 am Total time: 35 Visit # 2  This is a telephone encounter between [facility] name  and Isabel Chen  on 07/29/2021 for Medical Nutrition Therapy . The visit was conducted with the patient located at home and Norm Parcel  at [facility]. The patient's identity was confirmed using their DOB and current address. The patient has consented to being evaluated through a telephone encounter and understands the associated risks / benefits (allows the patient to remain at home, decreasing exposure to coronavirus). I personally spent 35 minutes on medical nutrition therapy discussion.   The following statements were read to the patient and/or legal guardian that are established with the Nutritional Health Provider.   "The purpose of this phone visit is to provide nutritional health care while limiting exposure to the coronavirus (COVID19).  There is a possibility of technology failure and discussed alternative modes of communication if that failure occurs."   "By engaging in this telephone visit, you consent to the provision of healthcare.  Additionally, you authorize for your insurance to be billed for the services provided during this telephone visit."    Patient and/or legal guardian consented to telephone visit: yes  Assessment:  Primary concerns today: putting her diabetes in remission. Patient has questions about what diabetes is and bow to care for it.  Her goal is to weigh eventually 165-190#. She would like to lose it at a reasonable rate so she can maintain it.  She states she wants a program and to stick with it to help her decrease her weight and keep it down. She wants to see her 47 month old granddaughter grow up.   Her goals are to Stop eating when full, cut out fried foods, sweet tooth- stopped buying some foods, little bag of baby  ruth reading  Self monitoring-cant stick her finger Lab Results  Component Value Date   HGBA1C 7.0 (A) 05/09/2021   HGBA1C 6.3 (H) 10/30/2018    Medicine- 0.5 mg Ozempic once a week- good  SLEEP:we did not discuss this in detail, and agreed to discuss at her next visit.    MEDICATIONS: ozempic weekly BLOOD SUGAR: she does not like her finger stuck Lab Results  Component Value Date   HGBA1C 7.0 (A) 05/09/2021   HGBA1C 6.3 (H) 10/30/2018    DIETARY INTAKE: Usual eating pattern includes 3 meals and ?  snacks per day. 24-hr recall :  (Up at  2-4 AM) B ( AM)- McDs oatmeal or yogurt parfait, coffee   L ( PM)- chicken wrap , water, tea-arnold palmer lite Snk ( PM)- trail mix  D ( PM)- fish, no bread, salad   Typical day? Yes.    Usual physical activity: feels she needs to do more, but has a hard time finding time when she feel safe enough to do  Estimated daily energy needs: for weight loss In order to maintain current weight:   2,535 Calories/day To reach goal of 300 lbs in 180 days:  1,777  Calories/day   Progress Towards Goal(s):  In progress.   Nutritional Diagnosis:  NI-1.5 Excessive energy intake As related to obesity and diabetes diagnoses likely improving  As evidenced by her report of decreased intake.    Intervention:  Nutrition review about diabetes pathophysiology, exercise, self monitoring of blood sugar using A1C and meal planning.  Action  Goal:record food and beverage intake for next month  Outcome goal: increased knowledge of how to eat balanced and nutritious meals to control blood sugar, hunger and provide weight loss Coordination of care: none at this time  Teaching Method Utilized: Visual, Auditory,Hands on Handouts given during visit include:meal planning and carbohydrate counting for diabetes, how to add me as a friend to her myfitnesspal account, hyperglycemia Barriers to learning/adherence to lifestyle change: competing values Demonstrated degree of  understanding via:  Teach Back   Monitoring/Evaluation:  Dietary intake, exercise, food records, and body weight in 4 week(s). By telehealth December with doctor Norm Parcel, RD 07/29/2021 8:19 AM.

## 2021-07-31 ENCOUNTER — Encounter: Payer: Self-pay | Admitting: Dietician

## 2021-07-31 NOTE — Patient Instructions (Addendum)
Margaretmary Eddy,   I sent you an invitation to be a friend in MyFitness pal so we can work in you learning how to [employer] and beverages.   It should be okay if you keep track of your A1C to self monitor instead of checking your blood sugars daily.   Here are you recent A1cs: Lab Results  Component Value Date   HGBA1C 7.0 (A) 05/09/2021   HGBA1C 6.3 (H) 10/30/2018    Keep making the healthy choices as best you can.   In the meantime, please call if you have questions. We'll talk again October 4.  Lupita Leash [phone]

## 2021-08-06 ENCOUNTER — Other Ambulatory Visit (HOSPITAL_COMMUNITY): Payer: Self-pay

## 2021-08-08 ENCOUNTER — Encounter: Payer: 59 | Admitting: Gastroenterology

## 2021-08-11 ENCOUNTER — Other Ambulatory Visit: Payer: Self-pay

## 2021-08-11 DIAGNOSIS — E119 Type 2 diabetes mellitus without complications: Secondary | ICD-10-CM

## 2021-08-11 MED ORDER — OZEMPIC (0.25 OR 0.5 MG/DOSE) 2 MG/1.5ML ~~LOC~~ SOPN: PEN_INJECTOR | SUBCUTANEOUS | 1 refills | Status: DC

## 2021-08-11 NOTE — Telephone Encounter (Signed)
Returned call to patient. She is requesting refill on Ozempic at [facility] on [address].

## 2021-08-11 NOTE — Telephone Encounter (Signed)
Requesting to speak with a nurse about Semaglutide,0.25 or 0.5MG /DOS, (OZEMPIC, 0.25 OR 0.5 MG/DOSE,) 2 MG/1.5ML SOPN. Please call back.

## 2021-08-26 ENCOUNTER — Other Ambulatory Visit: Payer: Self-pay | Admitting: Dietician

## 2021-08-26 ENCOUNTER — Encounter: Payer: Self-pay | Admitting: Dietician

## 2021-08-26 ENCOUNTER — Ambulatory Visit: Payer: 59 | Admitting: Dietician

## 2021-08-26 MED ORDER — SEMAGLUTIDE (1 MG/DOSE) 4 MG/3ML ~~LOC~~ SOPN: 1.0000 mg | PEN_INJECTOR | SUBCUTANEOUS | 0 refills | Status: DC

## 2021-08-26 NOTE — Telephone Encounter (Signed)
Refill sent. Patient is due for A1c check. Please have her schedule an appt to be seen in clinic some time this month.

## 2021-08-26 NOTE — Telephone Encounter (Signed)
Ms. [place] is requesting a new prescription for 1 mg semaglutide weekly per her directions. She has been taking 0.5 mg without problems and plans to take 2 doses of 0.5 mg this Sunday which should empty her pen. She asked that the prescription be sent to [facility] on [address]. and she'll pick it up.

## 2021-08-26 NOTE — Progress Notes (Signed)
Appt start time: 0920 am   End time: 1000 am Total time: 40 minutes Visit # 3  This is a telephone encounter between [facility]  name  and Isabel Chen  on 08/26/2021 for Medical Nutrition Therapy follow up. . The visit was conducted with the patient located at work and Corporate treasurer  at [facility]. The patient has consented to being evaluated through a telephone encounter and understands the associated risks / benefits (allows the patient to remain at home, decreasing exposure to coronavirus). I personally spent 40  minutes on medical nutrition therapy discussion.    Assessment:  Primary concerns today: putting her diabetes in remission.    Her goal is to weigh eventually 165-190#. She as 170 # in my fitness pal.   She has not been logging her food into Myfitness pal yet because she was unsure how to use it.  Her goals are to Stop eating when full, cut out fried foods, sweet tooth- stopped buying some foods, little bag of baby ruth.  To help her with her sweet tooth she is using animal crackers. She plans to put portions that she knows the calories in into snacks bags. She continues to read labels since she has done since being in [employer].   Self monitoring- does not check blood sugar every day, but when she does it is mostly early morning:  recent values are 126/130/96/101/87  She has not weighed since her last appointment here.  Estimated body mass index is 46.78 kg/m as calculated from the following:   Height as of 06/24/21: 5\' 10"  (1.778 m).   Weight as of 06/24/21: 326 lb (147.9 kg).   Wt Readings from Last 10 Encounters:  06/24/21 (!) 326 lb (147.9 kg)  06/24/21 (!) 325 lb (147.4 kg)  05/09/21 (!) 330 lb 14.4 oz (150.1 kg)  09/23/20 (!) 335 lb 12.8 oz (152.3 kg)  06/10/20 (!) 325 lb 14.4 oz (147.8 kg)  03/12/20 (!) 323 lb 12.8 oz (146.9 kg)  01/31/20 (!) 335 lb (152 kg)  12/28/19 (!) 335 lb 14.4 oz (152.4 kg)  10/18/19 (!) 360 lb 1.6 oz (163.3 kg)  09/25/19 (!) 335 lb (152  kg)    Lab Results  Component Value Date   HGBA1C 7.0 (A) 05/09/2021   HGBA1C 6.3 (H) 10/30/2018   A1c should be done at her next office visit in December.    BP Readings from Last 3 Encounters:  06/24/21 (!) 161/90  06/24/21 110/70  05/09/21 135/83    Medicine- 0.5 mg Ozempic once a week; start 1 mg pen next week  SLEEP:we did not discuss this in detail today.    MEDICATIONS: 0.5 mg semaglutide weekly, she was supposed to increase to 1 mg, but does not have the correct pen for that dose. Losartan,amlodipine  DIETARY INTAKE: Usual eating pattern includes 1-2 meals and ?  snacks per day. 24-hr recall : not done today    Usual physical activity: feels she needs to do more, but has a hard time finding time when she feel safe enough to do. Myfitness pal set her gol as 10000 steps per day. She is thinking about joining a gym to try to work on this goal  Estimated daily energy needs: for weight loss In order to maintain current weight:   2,535 Calories/day To reach goal of 300 lbs in 180 days:  1,777  Calories/day   Progress Towards Goal(s):  In progress.   Nutritional Diagnosis:  NI-1.5 Excessive energy intake  As related to obesity and diabetes diagnoses likely improving  As evidenced by her report of decreased intake.    Intervention:  Nutrition education about how to use Myfitnesspal for self monitoring her calorie intake and activity.  Action Goal:record food and beverage intake for next month  Outcome goal: increased knowledge of how to eat balanced and nutritious meals to control blood sugar, hunger and provide weight loss Coordination of care: requested on her behalf a new prescription for 1 mg semaglutide weekly so she can follow her doctor's directions.   Teaching Method Utilized: Visual, Auditory,Hands on Handouts given during visit include:meal planning and carbohydrate counting for diabetes, how to add me as a friend to her myfitnesspal account,  hyperglycemia Barriers to learning/adherence to lifestyle change: competing values Demonstrated degree of understanding via:  Teach Back   Monitoring/Evaluation:  Dietary intake, exercise, food records, and body weight in 4 week(s). By telehealth December with doctor  Norm Parcel, RD 08/26/2021 9:15 AM.

## 2021-08-26 NOTE — Patient Instructions (Addendum)
You are due for a follow up with your doctor in December.   They should do another A1c then.   We will also get an updated weight.  New prescription for 1 mg Ozempic [facility] has been requested on [address].  Please log your food and beverages (you can omit water if you'd like) on myfitnesspal.  Once you fell that this is easy, then look at your totals and compare them to th goals set for you.  Do they sound correct?  How close is your intake to their goals? If not close, hoiw can you adjust your intake or activity to try to get closer?   For weight loss;  1- Reduced Calories are most important,  2- then increased fiber 25-35 grams a day (this is where whole fruit, veggies, whole grains beans and nuts come in)   You are doing great!!! Keep up the great work caring for you!!!!  Call or send a message anytime.  Isabel Chen [phone]

## 2021-09-08 ENCOUNTER — Other Ambulatory Visit: Payer: Self-pay | Admitting: Internal Medicine

## 2021-09-10 MED ORDER — SEMAGLUTIDE (1 MG/DOSE) 4 MG/3ML ~~LOC~~ SOPN: 1.0000 mg | PEN_INJECTOR | SUBCUTANEOUS | 0 refills | Status: DC

## 2021-09-12 ENCOUNTER — Ambulatory Visit (AMBULATORY_SURGERY_CENTER): Payer: 59 | Admitting: *Deleted

## 2021-09-12 ENCOUNTER — Encounter: Payer: Self-pay | Admitting: *Deleted

## 2021-09-12 ENCOUNTER — Other Ambulatory Visit: Payer: Self-pay

## 2021-09-12 VITALS — Ht 70.0 in | Wt 325.0 lb

## 2021-09-12 DIAGNOSIS — Z1211 Encounter for screening for malignant neoplasm of colon: Secondary | ICD-10-CM

## 2021-09-12 NOTE — Progress Notes (Signed)
Patient's pre-visit was done today over the phone with the patient due to COVID-19 pandemic. Name,DOB and address verified. Patient denies any allergies to Eggs and Soy. Patient denies any problems with anesthesia/sedation. Patient is not taking any diet pills, pt is on blood thinner-Xarelto. No home Oxygen. New prep instructions went over w/the pt and sent to pt's Mychart-Pt aware. Patient has the Moviprep at home. Patient understands to call us back with any questions or concerns. Patient is aware of our care-partner policy and Covid-19 safety protocol. Patient denies any medical chart hx info changes since last GI OV.  The patient is COVID-19 vaccinated.

## 2021-09-19 ENCOUNTER — Ambulatory Visit (INDEPENDENT_AMBULATORY_CARE_PROVIDER_SITE_OTHER): Payer: 59 | Admitting: Internal Medicine

## 2021-09-19 ENCOUNTER — Encounter: Payer: Self-pay | Admitting: Internal Medicine

## 2021-09-19 VITALS — BP 155/59 | HR 83 | Wt 325.2 lb

## 2021-09-19 DIAGNOSIS — Z0000 Encounter for general adult medical examination without abnormal findings: Secondary | ICD-10-CM

## 2021-09-19 DIAGNOSIS — E039 Hypothyroidism, unspecified: Secondary | ICD-10-CM

## 2021-09-19 DIAGNOSIS — Z13820 Encounter for screening for osteoporosis: Secondary | ICD-10-CM

## 2021-09-19 DIAGNOSIS — E559 Vitamin D deficiency, unspecified: Secondary | ICD-10-CM

## 2021-09-19 DIAGNOSIS — E119 Type 2 diabetes mellitus without complications: Secondary | ICD-10-CM | POA: Diagnosis not present

## 2021-09-19 DIAGNOSIS — E785 Hyperlipidemia, unspecified: Secondary | ICD-10-CM

## 2021-09-19 DIAGNOSIS — K219 Gastro-esophageal reflux disease without esophagitis: Secondary | ICD-10-CM

## 2021-09-19 DIAGNOSIS — I10 Essential (primary) hypertension: Secondary | ICD-10-CM | POA: Diagnosis not present

## 2021-09-19 LAB — POCT GLYCOSYLATED HEMOGLOBIN (HGB A1C): Hemoglobin A1C: 6.7 % — AB (ref 4.0–5.6)

## 2021-09-19 LAB — GLUCOSE, CAPILLARY: Glucose-Capillary: 155 mg/dL — ABNORMAL HIGH (ref 70–99)

## 2021-09-19 MED ORDER — FAMOTIDINE 20 MG PO TABS: 20.0000 mg | ORAL_TABLET | Freq: Every day | ORAL | 2 refills | Status: DC

## 2021-09-19 MED ORDER — SEMAGLUTIDE (1 MG/DOSE) 4 MG/3ML ~~LOC~~ SOPN: 1.0000 mg | PEN_INJECTOR | SUBCUTANEOUS | 1 refills | Status: DC

## 2021-09-19 MED ORDER — LOSARTAN POTASSIUM 25 MG PO TABS: ORAL_TABLET | ORAL | 1 refills | Status: DC

## 2021-09-19 MED ORDER — LEVOTHYROXINE SODIUM 50 MCG PO TABS: ORAL_TABLET | ORAL | 2 refills | Status: DC

## 2021-09-19 MED ORDER — ATORVASTATIN CALCIUM 40 MG PO TABS: 40.0000 mg | ORAL_TABLET | Freq: Every day | ORAL | 3 refills | Status: DC

## 2021-09-19 MED ORDER — AMLODIPINE BESYLATE 10 MG PO TABS: ORAL_TABLET | ORAL | 0 refills | Status: DC

## 2021-09-19 NOTE — Progress Notes (Signed)
   CC: Type 2 diabetes mellitus, essential hypertension, screening for osteoporosis, hyperlipidemia, gastroesophageal reflux disease, hypothyroidism, vitamin D deficiency  HPI:[name] is a 65 y.o. female who presents for evaluation of type 2 diabetes mellitus, essential hypertension, screening for osteoporosis, hyperlipidemia, gastroesophageal reflux disease, hypothyroidism, vitamin D deficiency. Please see individual problem based A/P for details.  Past Medical History:  Diagnosis Date   Arthritis    GERD (gastroesophageal reflux disease)    Heart murmur yrs ago   History of bronchitis last 2016   Pneumonia 2010   Thyroid disease    Review of Systems:   Review of Systems  Constitutional:  Negative for chills and fever.  Cardiovascular:  Positive for leg swelling. Negative for chest pain and PND.  Neurological:  Negative for dizziness and headaches.    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  No murmurs, rubs, or gallops Pulmonary : Equal breath sounds, No wheezes, rales, or rhonchi Abdominal: soft, nontender,  bowel sounds present Ext: Bilateral lower extremity edema , 1+ edema up  ankle high. Equal and present dorsalis pedis and posterior tibial pulse. Good sensation on dorsal and plantar surface bilaterally .  Assessment & Plan:   See Encounters Tab for problem based charting.  Patient discussed with Dr. Mayford Knife

## 2021-09-19 NOTE — Patient Instructions (Signed)
Thank you for trusting me with your care. To recap, today we discussed the following:   1. Type 2 diabetes mellitus without complication, without long-term current use of insulin (HCC)  - POC Hbg A1C - Ambulatory referral to Ophthalmology - Semaglutide, 1 MG/DOSE, 4 MG/3ML SOPN; Inject 1 mg into the skin once a week.  Dispense: 15 mL; Refill: 1  2. Essential hypertension  - losartan (COZAAR) 25 MG tablet; TAKE 1 TABLET(25 MG) BY MOUTH DAILY  Dispense: 90 tablet; Refill: 1 - amLODipine (NORVASC) 10 MG tablet; Take 1 tablet daily  Dispense: 90 tablet; Refill: 0 - BMP8+Anion Gap  3. Screening for osteoporosis  - DG Bone Density; Future  4. Hyperlipidemia, unspecified hyperlipidemia type  - Lipid Profile - atorvastatin (LIPITOR) 40 MG tablet; Take 1 tablet (40 mg total) by mouth daily at 6 PM.  Dispense: 90 tablet; Refill: 3  5. Gastroesophageal reflux disease, unspecified whether esophagitis present  - famotidine (PEPCID) 20 MG tablet; Take 1 tablet (20 mg total) by mouth at bedtime.  Dispense: 90 tablet; Refill: 2  6. Hypothyroidism, unspecified type  - levothyroxine (SYNTHROID) 50 MCG tablet; TAKE 1 TABLET(50 MCG) BY MOUTH DAILY  Dispense: 90 tablet; Refill: 2

## 2021-09-20 LAB — BMP8+ANION GAP
Anion Gap: 17 mmol/L (ref 10.0–18.0)
BUN/Creatinine Ratio: 12 (ref 12–28)
BUN: 14 mg/dL (ref 8–27)
CO2: 24 mmol/L (ref 20–29)
Calcium: 9.6 mg/dL (ref 8.7–10.3)
Chloride: 106 mmol/L (ref 96–106)
Creatinine, Ser: 1.16 mg/dL — ABNORMAL HIGH (ref 0.57–1.00)
Glucose: 91 mg/dL (ref 70–99)
Potassium: 3.8 mmol/L (ref 3.5–5.2)
Sodium: 147 mmol/L — ABNORMAL HIGH (ref 134–144)
eGFR: 52 mL/min/{1.73_m2} — ABNORMAL LOW (ref >59)

## 2021-09-20 LAB — VITAMIN D 25 HYDROXY (VIT D DEFICIENCY, FRACTURES): Vit D, 25-Hydroxy: 19.4 ng/mL — ABNORMAL LOW (ref 30.0–100.0)

## 2021-09-20 LAB — LIPID PANEL
Chol/HDL Ratio: 3.1 ratio (ref 0.0–4.4)
Cholesterol, Total: 131 mg/dL (ref 100–199)
HDL: 42 mg/dL (ref >39)
LDL Chol Calc (NIH): 73 mg/dL (ref 0–99)
Triglycerides: 79 mg/dL (ref 0–149)
VLDL Cholesterol Cal: 16 mg/dL (ref 5–40)

## 2021-09-21 ENCOUNTER — Telehealth: Payer: Self-pay | Admitting: Internal Medicine

## 2021-09-21 ENCOUNTER — Encounter: Payer: Self-pay | Admitting: Internal Medicine

## 2021-09-21 DIAGNOSIS — Z13820 Encounter for screening for osteoporosis: Secondary | ICD-10-CM | POA: Insufficient documentation

## 2021-09-21 DIAGNOSIS — I10 Essential (primary) hypertension: Secondary | ICD-10-CM

## 2021-09-21 DIAGNOSIS — E785 Hyperlipidemia, unspecified: Secondary | ICD-10-CM | POA: Insufficient documentation

## 2021-09-21 DIAGNOSIS — E559 Vitamin D deficiency, unspecified: Secondary | ICD-10-CM | POA: Insufficient documentation

## 2021-09-21 DIAGNOSIS — Z0000 Encounter for general adult medical examination without abnormal findings: Secondary | ICD-10-CM | POA: Insufficient documentation

## 2021-09-21 MED ORDER — CHOLECALCIFEROL 20 MCG (800 UNIT) PO TABS: 1.0000 | ORAL_TABLET | Freq: Every day | ORAL | 1 refills | Status: DC

## 2021-09-21 NOTE — Assessment & Plan Note (Signed)
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 Checked Vitamin D (25 hydroxy) 19.4. Will recommend starting supplementation  And rechecking in 3 months.

## 2021-09-21 NOTE — Assessment & Plan Note (Signed)
  Patient's BP today is 155/59, recheck normotensive. with a goal of <140/80. The patient endorses adherence to her medication regimen.  Has edema in foot and ankles , patient does not find correlation with amlodipine.   Assessment/ Plan: Essential hypertension. Refilled medications. Recommended compression for lower extremity edema. If edema worsens in the future then patient may want to stop amlodipine and consider another blood pressure medication.  - losartan (COZAAR) 25 MG tablet; TAKE 1 TABLET(25 MG) BY MOUTH DAILY  Dispense: 90 tablet; Refill: 1 - amLODipine (NORVASC) 10 MG tablet; Take 1 tablet daily  Dispense: 90 tablet; Refill: 0 - BMP8+Anion Gap

## 2021-09-21 NOTE — Assessment & Plan Note (Signed)
Patient compliant on statin for primary prevention. Lipid panel 3 years ago .    Assessment/ Plan: Hyperlipidemia controlled on atorvastatin 40 mg daily . Continue current dosage.  - Lipid Profile( results below) - Refilled - atorvastatin (LIPITOR) 40 MG tablet; Take 1 tablet (40 mg total) by mouth daily at 6 PM.  Dispense: 90 tablet; Refill: 3  Lipid Panel     Component Value Date/Time   CHOL 131 09/19/2021 1205   TRIG 79 09/19/2021 1205   HDL 42 09/19/2021 1205   CHOLHDL 3.1 09/19/2021 1205   CHOLHDL 4.5 10/30/2018 0451   VLDL 20 10/30/2018 0451   LDLCALC 73 09/19/2021 1205   LABVLDL 16 09/19/2021 1205

## 2021-09-21 NOTE — Assessment & Plan Note (Signed)
Patient on Protonix and Pepcid.Discussed she does not need to take both medications.  Assessment/Plan: Gastroesophageal reflux disease - Stop Protonix - famotidine (PEPCID) 20 MG tablet; Take 1 tablet (20 mg total) by mouth at bedtime.  Dispense: 90 tablet; Refill: 2rotonix and patient will continue Pepcid.

## 2021-09-21 NOTE — Assessment & Plan Note (Signed)
  Hgb A1c 7% at the last visit. Current A1c 6.3%. Currently taking Semaglutide 1 mg once weekly. Foot exam completed, no peripheral neuropathy. Referral to opthalmology placed.  Assessment/Plan: Type 2 diabetes mellitus without complications, without long-term current use of insulin - Ambulatory referral to Ophthalmology - Semaglutide, 1 MG/DOSE, 4 MG/3ML SOPN; Inject 1 mg into the skin once a week.  Dispense: 15 mL; Refill: 1 Repeat A1c at next visit if after 3 months from this date

## 2021-09-21 NOTE — Telephone Encounter (Signed)
Mychart message sent with result. Will call patient on Monday.

## 2021-09-21 NOTE — Assessment & Plan Note (Signed)
Reports history of fracture as a child. Mother was also obese, but had osteoporosis. Will send for screening with Dexa scan. Checking Vitamin D today.

## 2021-09-21 NOTE — Assessment & Plan Note (Addendum)
Lab Results  Component Value Date   TSH 3.020 05/09/2021    refilled - levothyroxine (SYNTHROID) 50 MCG tablet; TAKE 1 TABLET(50 MCG) BY MOUTH DAILY  Dispense: 90 tablet; Refill: 2

## 2021-09-21 NOTE — Assessment & Plan Note (Signed)
Colonoscopy - scheduled next friday Foot exam - today Dexa Scan - referral today Zoster - patient reports done and will bring records to next visit Pneumonia - next visit Flu Vaccine - today  Covid- 19 Vaccination: Vaccinated and received multiple boosters. Will bring record to next visit.

## 2021-09-23 ENCOUNTER — Ambulatory Visit (INDEPENDENT_AMBULATORY_CARE_PROVIDER_SITE_OTHER): Payer: 59 | Admitting: Dietician

## 2021-09-23 ENCOUNTER — Encounter: Payer: Self-pay | Admitting: Dietician

## 2021-09-23 DIAGNOSIS — E119 Type 2 diabetes mellitus without complications: Secondary | ICD-10-CM

## 2021-09-23 DIAGNOSIS — Z713 Dietary counseling and surveillance: Secondary | ICD-10-CM | POA: Diagnosis not present

## 2021-09-23 NOTE — Patient Instructions (Signed)
Thank you for your visit today!  We talked about:   - it's okay to eat smaller portions of whole plant based foods( fruit and bread)  that raise blood sugar - a whole, plant based diet is most effective for type 2 diabetes remission - a higher intake of vegetables, whole grains, nuts, seeds and beans also helps lower your blood pressure   Thanksgiving Tips: Enjoy the celebration and the people you are with, take a walk after your meal, be thankful for all you have, not just the food...  Be sure to eat a healthy breakfast or snack earlier in the day Be sure there are plenty of vegetable dishes by taking one of your own  Load up on vegetables at dinner, Limit number of starchy foods on your plate. Go for a walk to do other activity to move more between dinner and dessert If having dessert, have it several hours later as a meal and limit portions  Goals to work on:   -[employer] - logging foods into [employer] pal to help you keep track of calorie intake  Please feel free to call me anytime.  Lupita Leash [phone]

## 2021-09-23 NOTE — Progress Notes (Signed)
Appt start time: 0915 am   End time:  950 am Total time: 35 minutes Visit # 4  This is a telephone encounter between [facility]  name  and Isabel Chen  on 09/23/2021 for Medical Nutrition Therapy follow up. . The visit was conducted with the patient located at work and Investment banker, operational  at [facility]. The patient has consented to being evaluated through a telephone encounter and understands the associated risks / benefits (allows the patient to remain at home, decreasing exposure to coronavirus). I personally spent 35 minutes on medical nutrition therapy discussion.    Assessment:  Primary concerns today: putting her diabetes in remission.    Her goal is to weigh eventually 165-190#.  Her goal is 170 # in my fitness pal.   She has had problems logging her food into Myfitness pal, but is figuring it out.  Calories per day 2130 calories. 639 for meal, 213 for snacks.  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  sweet tooth- tells herself "I don't need it", I walk away or buy fruit.   She is still planning to put portions that she knows the calories in into snacks bags.    Self monitoring- does not check blood sugar every day, but when she does it is mostly early morning:  recent values are 104, 103, 110, 111,104, 81, 97, 111.  Estimated body mass index is 46.66 kg/m as calculated from the following:   Height as of 09/12/21: _0  (1.778 m).   Weight as of 09/19/21: 325 lb 3.2 oz (147.5 kg).  Wt Readings from Last 10 Encounters:  09/19/21 (!) 325 lb 3.2 oz (147.5 kg)  09/12/21 (!) 325 lb (147.4 kg)  06/24/21 (!) 326 lb (147.9 kg)  06/24/21 (!) 325 lb (147.4 kg)  05/09/21 (!) 330 lb 14.4 oz (150.1 kg)  09/23/20 (!) 335 lb 12.8 oz (152.3 kg)  06/10/20 (!) 325 lb 14.4 oz (147.8 kg)  03/12/20 (!) 323 lb 12.8 oz (146.9 kg)   01/31/20 (!) 335 lb (152 kg)  12/28/19 (!) 335 lb 14.4 oz (152.4 kg)   Lab Results  Component Value Date   HGBA1C 6.7 (A) 09/19/2021   HGBA1C 7.0 (A) 05/09/2021   HGBA1C 6.3 (H) 10/30/2018   BP Readings from Last 3 Encounters:  09/19/21 (!) 155/59  06/24/21 (!) 161/90  06/24/21 110/70   Medicine- 1 mg Ozempic once a week x2 weeks, Losartan,amlodipine  SLEEP: she states she finds it hard to keep a set schedule, sleeps 8 am, sleeps during the day.  Tosses and turns, at most 5-6 hours a day.   DIETARY INTAKE: Usual eating pattern includes 1-2 meals and 1-3 snacks per day. 24-hr recall : not done today    Usual physical activity:  She hopes to work on her [employer] membership this week to try to work on this goal  Estimated daily energy needs: for weight loss In order to maintain current weight:   2,535 Calories/day To reach goal of 300 lbs in 180 days:  1,777  Calories/day   Progress Towards Goal(s):  In progress.   Nutritional Diagnosis:  NI-1.5 Excessive energy intake As related to obesity and diabetes diagnoses likely improving  As evidenced by her report of decreased intake, stable weight with improved  blood sugars.    Intervention:  Nutrition education about how to use Myfitnesspal for self monitoring her calorie intake and activity, assessment of progress on goals and outcomes. Tips for eating healthy and staying on track during the holidays.  Action Goal:record food and beverage intake for next month  Outcome goal: increased knowledge of how to eat balanced and nutritious meals to control blood sugar, hunger and provide weight loss Coordination of care:  none  Teaching Method Utilized: Visual, Auditory,Hands on Handouts given during visit include: tips for the holidays, After visit summary Barriers to learning/adherence to lifestyle change: competing values Demonstrated degree of understanding via:  Teach Back   Monitoring/Evaluation:  Dietary intake,  exercise, food records, and body weight in 4 week(s). By [place], RD 09/23/2021 9:14 AM.

## 2021-09-26 ENCOUNTER — Other Ambulatory Visit: Payer: Self-pay

## 2021-09-26 ENCOUNTER — Ambulatory Visit (AMBULATORY_SURGERY_CENTER): Payer: 59 | Admitting: Gastroenterology

## 2021-09-26 ENCOUNTER — Encounter: Payer: Self-pay | Admitting: Gastroenterology

## 2021-09-26 VITALS — BP 134/71 | HR 78 | Temp 96.2°F | Resp 19 | Ht 70.0 in | Wt 325.0 lb

## 2021-09-26 DIAGNOSIS — Z1211 Encounter for screening for malignant neoplasm of colon: Secondary | ICD-10-CM

## 2021-09-26 MED ORDER — SODIUM CHLORIDE 0.9 % IV SOLN: 500.0000 mL | Freq: Once | INTRAVENOUS | Status: DC

## 2021-09-26 NOTE — Progress Notes (Signed)
VS completed by DT.  Pt's states no medical or surgical changes since previsit or office visit.  

## 2021-09-26 NOTE — Patient Instructions (Signed)
Discharge instructions given. Handouts on Diverticulosis and Hemorrhoids. Resume previous medications including Xarelto. YOU HAD AN ENDOSCOPIC PROCEDURE TODAY AT THE [facility]:   Refer to the procedure report that was given to you for any specific questions about what was found during the examination.  If the procedure report does not answer your questions, please call your gastroenterologist to clarify.  If you requested that your care partner not be given the details of your procedure findings, then the procedure report has been included in a sealed envelope for you to review at your convenience later.  YOU SHOULD EXPECT: Some feelings of bloating in the abdomen. Passage of more gas than usual.  Walking can help get rid of the air that was put into your GI tract during the procedure and reduce the bloating. If you had a lower endoscopy (such as a colonoscopy or flexible sigmoidoscopy) you may notice spotting of blood in your stool or on the toilet paper. If you underwent a bowel prep for your procedure, you may not have a normal bowel movement for a few days.  Please Note:  You might notice some irritation and congestion in your nose or some drainage.  This is from the oxygen used during your procedure.  There is no need for concern and it should clear up in a day or so.  SYMPTOMS TO REPORT IMMEDIATELY:  Following lower endoscopy (colonoscopy or flexible sigmoidoscopy):  Excessive amounts of blood in the stool  Significant tenderness or worsening of abdominal pains  Swelling of the abdomen that is new, acute  Fever of 100F or higher  For urgent or emergent issues, a gastroenterologist can be reached at any hour by calling [phone]. Do not use MyChart messaging for urgent concerns.    DIET:  We do recommend a small meal at first, but then you may proceed to your regular diet.  Drink plenty of fluids but you should avoid alcoholic beverages for 24 hours.  ACTIVITY:  You  should plan to take it easy for the rest of today and you should NOT DRIVE or use heavy machinery until tomorrow (because of the sedation medicines used during the test).    FOLLOW UP: Our staff will call the number listed on your records 48-72 hours following your procedure to check on you and address any questions or concerns that you may have regarding the information given to you following your procedure. If we do not reach you, we will leave a message.  We will attempt to reach you two times.  During this call, we will ask if you have developed any symptoms of COVID 19. If you develop any symptoms (ie: fever, flu-like symptoms, shortness of breath, cough etc.) before then, please call [phone].  If you test positive for Covid 19 in the 2 weeks post procedure, please call and report this information to [place].    If any biopsies were taken you will be contacted by phone or by letter within the next 1-3 weeks.  Please call us at [phone] if you have not heard about the biopsies in 3 weeks.    SIGNATURES/CONFIDENTIALITY: You and/or your care partner have signed paperwork which will be entered into your electronic medical record.  These signatures attest to the fact that that the information above on your After Visit Summary has been reviewed and is understood.  Full responsibility of the confidentiality of this discharge information lies with you and/or your care-partner.

## 2021-09-26 NOTE — Progress Notes (Signed)
PT taken to PACU. Monitors in place. VSS. Report given to RN. 

## 2021-09-26 NOTE — Progress Notes (Signed)
[facility] History and Physical   Primary Care Physician:  Lajean Manes, MD   Reason for Procedure:   Colon cancer screening  Plan:    Screening colonoscopy     HPI: JENNIPHER WEATHERHOLTZ is a 65 y.o. female undergoing initial average risk screening colonoscopy. She has no chronic GI symptoms and no family history of colon cancer.   Past Medical History:  Diagnosis Date   Arthritis    GERD (gastroesophageal reflux disease)    Heart murmur yrs ago   History of bronchitis last 2016   Pneumonia 2010   Thyroid disease     Past Surgical History:  Procedure Laterality Date   ABDOMINAL HYSTERECTOMY     partial   APPENDECTOMY  yrs ago   with exploratory surgery   BUNIONECTOMY Right    exploratory surgery  yrs ago   HIP SURGERY Right    for bursa   ROTATOR CUFF REPAIR Right    THYROID LOBECTOMY Left 06/25/2016   Procedure: LEFT THYROID LOBECTOMY;  Surgeon: Armandina Gemma, MD;  Location: [facility];  Service: General;  Laterality: Left;    Prior to Admission medications   Medication Sig Start Date End Date Taking? Authorizing Provider  amLODipine (NORVASC) 10 MG tablet Take 1 tablet daily 09/19/21  Yes Madalyn Rob, MD  atorvastatin (LIPITOR) 40 MG tablet Take 1 tablet (40 mg total) by mouth daily at 6 PM. 09/19/21  Yes Madalyn Rob, MD  Blood Glucose Monitoring Suppl (FREESTYLE LITE) w/Device KIT Use as directed 06/25/21  Yes Sid Falcon, MD  famotidine (PEPCID) 20 MG tablet Take 1 tablet (20 mg total) by mouth at bedtime. 09/19/21  Yes Madalyn Rob, MD  glucose blood (FREESTYLE LITE) test strip Use as instructed to check blood sugars daily 06/25/21  Yes Sid Falcon, MD  Lancets (FREESTYLE) lancets Use as directed to test once daily 06/25/21  Yes Sid Falcon, MD  levothyroxine (SYNTHROID) 50 MCG tablet TAKE 1 TABLET(50 MCG) BY MOUTH DAILY 09/19/21  Yes Madalyn Rob, MD  losartan (COZAAR) 25 MG tablet TAKE 1 TABLET(25 MG) BY MOUTH DAILY 09/19/21  Yes Madalyn Rob, MD   camphor-menthol [facility]) lotion Apply 1 application topically as needed for itching. 12/25/20   Andrew Au, MD  Cholecalciferol 20 MCG (800 UNIT) TABS Take 1 tablet by mouth daily at 6 (six) AM. 09/21/21   Madalyn Rob, MD  rivaroxaban (XARELTO) 20 MG TABS tablet Take 1 tablet (20 mg total) by mouth daily with supper. 06/30/21   Rehman, Areeg N, DO  Semaglutide, 1 MG/DOSE, 4 MG/3ML SOPN Inject 1 mg into the skin once a week. 09/19/21   Madalyn Rob, MD    Current Outpatient Medications  Medication Sig Dispense Refill   amLODipine (NORVASC) 10 MG tablet Take 1 tablet daily 90 tablet 0   atorvastatin (LIPITOR) 40 MG tablet Take 1 tablet (40 mg total) by mouth daily at 6 PM. 90 tablet 3   Blood Glucose Monitoring Suppl (FREESTYLE LITE) w/Device KIT Use as directed 1 kit 0   famotidine (PEPCID) 20 MG tablet Take 1 tablet (20 mg total) by mouth at bedtime. 90 tablet 2   glucose blood (FREESTYLE LITE) test strip Use as instructed to check blood sugars daily 100 each 0   Lancets (FREESTYLE) lancets Use as directed to test once daily 100 each 0   levothyroxine (SYNTHROID) 50 MCG tablet TAKE 1 TABLET(50 MCG) BY MOUTH DAILY 90 tablet 2   losartan (COZAAR) 25 MG tablet TAKE 1  TABLET(25 MG) BY MOUTH DAILY 90 tablet 1   camphor-menthol (SARNA) lotion Apply 1 application topically as needed for itching. 222 mL 1   Cholecalciferol 20 MCG (800 UNIT) TABS Take 1 tablet by mouth daily at 6 (six) AM. 75 tablet 1   rivaroxaban (XARELTO) 20 MG TABS tablet Take 1 tablet (20 mg total) by mouth daily with supper. 90 tablet 1   Semaglutide, 1 MG/DOSE, 4 MG/3ML SOPN Inject 1 mg into the skin once a week. 15 mL 1   Current Facility-Administered Medications  Medication Dose Route Frequency Provider Last Rate Last Admin   0.9 %  sodium chloride infusion  500 mL Intravenous Once Daryel November, MD        Allergies as of 09/26/2021 - Review Complete 09/26/2021  Allergen Reaction Noted   Tetracyclines & related  Hives 02/24/2013    Family History  Problem Relation Age of Onset   Diabetes Mother    Hypertension Mother    Stroke Father    Cancer Maternal Grandmother        pancreatic    Social History   Socioeconomic History   Marital status: Widowed    Spouse name: Not on file   Number of children: Not on file   Years of education: Not on file   Highest education level: Not on file  Occupational History   Not on file  Tobacco Use   Smoking status: Former    Packs/day: 0.25    Years: 10.00    Pack years: 2.50    Types: Cigarettes    Quit date: 11/24/1983    Years since quitting: 37.8   Smokeless tobacco: Never  Vaping Use   Vaping Use: Never used  Substance and Sexual Activity   Alcohol use: Yes    Comment: glass of wine on new year's eve   Drug use: No   Sexual activity: Not on file  Other Topics Concern   Not on file  Social History Narrative   Not on file   Social Determinants of Health   Financial Resource Strain: Not on file  Food Insecurity: Not on file  Transportation Needs: Not on file  Physical Activity: Not on file  Stress: Not on file  Social Connections: Not on file  Intimate Partner Violence: Not on file    Review of Systems: All other review of systems negative except as mentioned in the HPI.  Physical Exam: Vital signs BP 135/70   Pulse 76   Temp (!) 96.2 F (35.7 C) (Temporal)   Ht _0  (1.778 m)   Wt (!) 325 lb (147.4 kg)   SpO2 98%   BMI 46.63 kg/m   General:   Alert,  Well-developed, well-nourished, pleasant and cooperative in NAD Lungs:  Clear throughout to auscultation.   Heart:  Regular rate and rhythm; no murmurs, clicks, rubs,  or gallops. Abdomen:  Soft, nontender and nondistended. Normal bowel sounds.   Neuro/Psych:  Normal mood and affect. A and O x 3   Skarleth Delmonico E. Candis Schatz, MD [facility]

## 2021-09-26 NOTE — Op Note (Signed)
[facility] Patient Name: Isabel Chen Procedure Date: 09/26/2021 10:30 AM MRN: [id number] Endoscopist: Lorin Picket E. Tomasa Rand , MD Age: 65 Referring MD:  Date of Birth: [birth year] Gender: Female Account #: [id number] Procedure:                Colonoscopy Indications:              Screening for colorectal malignant neoplasm, This                            is the patient's first colonoscopy Medicines:                Monitored Anesthesia Care Procedure:                Pre-Anesthesia Assessment:                           - Prior to the procedure, a History and Physical                            was performed, and patient medications and                            allergies were reviewed. The patient's tolerance of                            previous anesthesia was also reviewed. The risks                            and benefits of the procedure and the sedation                            options and risks were discussed with the patient.                            All questions were answered, and informed consent                            was obtained. Prior Anticoagulants: The patient has                            taken Xarelto (rivaroxaban), last dose was 2 days                            prior to procedure. ASA Grade Assessment: III - A                            patient with severe systemic disease. After                            reviewing the risks and benefits, the patient was                            deemed in satisfactory condition to undergo the  procedure.                           After obtaining informed consent, the colonoscope                            was passed under direct vision. Throughout the                            procedure, the patient's blood pressure, pulse, and                            oxygen saturations were monitored continuously. The                            Olympus CF-HQ190L [id number]) Colonoscope was                             introduced through the anus and advanced to the the                            cecum, identified by appendiceal orifice and                            ileocecal valve. The colonoscopy was performed                            without difficulty. The patient tolerated the                            procedure well. The quality of the bowel                            preparation was adequate. The ileocecal valve,                            appendiceal orifice, and rectum were photographed. Scope In: 10:36:37 AM Scope Out: 10:55:34 AM Scope Withdrawal Time: 0 hours 12 minutes 9 seconds  Total Procedure Duration: 0 hours 18 minutes 57 seconds  Findings:                 The perianal and digital rectal examinations were                            normal. Pertinent negatives include normal                            sphincter tone and no palpable rectal lesions.                           Many small and large-mouthed diverticula were found                            in the sigmoid colon and descending colon.  The exam was otherwise normal throughout the                            examined colon.                           Non-bleeding internal hemorrhoids were found during                            retroflexion. The hemorrhoids were Grade I                            (internal hemorrhoids that do not prolapse).                           No additional abnormalities were found on                            retroflexion. Complications:            No immediate complications. Estimated Blood Loss:     Estimated blood loss: none. Impression:               - Diverticulosis in the sigmoid colon and in the                            descending colon.                           - Non-bleeding internal hemorrhoids.                           - No specimens collected. Recommendation:           - Patient has a contact number available for                             emergencies. The signs and symptoms of potential                            delayed complications were discussed with the                            patient. Return to normal activities tomorrow.                            Written discharge instructions were provided to the                            patient.                           - Resume previous diet.                           - Continue present medications.                           -  Repeat colonoscopy in 10 years for screening                            purposes.                           - Resume Xarelto (rivaroxaban) at prior dose today. Catarino Vold E. Tomasa Rand, MD 09/26/2021 11:01:22 AM This report has been signed electronically.

## 2021-09-30 ENCOUNTER — Telehealth: Payer: Self-pay

## 2021-09-30 ENCOUNTER — Telehealth: Payer: Self-pay | Admitting: *Deleted

## 2021-09-30 ENCOUNTER — Other Ambulatory Visit: Payer: Self-pay | Admitting: Internal Medicine

## 2021-09-30 DIAGNOSIS — I82452 Acute embolism and thrombosis of left peroneal vein: Secondary | ICD-10-CM

## 2021-09-30 NOTE — Telephone Encounter (Signed)
Attempted to reach pt. With follow-up call following endoscopic procedure 09/26/2021.  LM on pt. Voice mail to call if she has any questions or concerns. 

## 2021-09-30 NOTE — Telephone Encounter (Signed)
Message left

## 2021-10-05 NOTE — Progress Notes (Signed)
Internal Medicine Clinic Attending  I saw and evaluated the patient.  I personally confirmed the key portions of the history and exam documented by Dr. Steen and I reviewed pertinent patient test results.  The assessment, diagnosis, and plan were formulated together and I agree with the documentation in the resident's note.     

## 2021-10-21 ENCOUNTER — Other Ambulatory Visit: Payer: Self-pay

## 2021-10-21 ENCOUNTER — Ambulatory Visit: Payer: 59 | Admitting: Dietician

## 2021-10-21 ENCOUNTER — Other Ambulatory Visit: Payer: 59

## 2021-11-11 ENCOUNTER — Ambulatory Visit
Admission: RE | Admit: 2021-11-11 | Discharge: 2021-11-11 | Disposition: A | Discharge status: Home / Self Care | Payer: 59 | Source: Ambulatory Visit | Attending: Internal Medicine | Admitting: Internal Medicine

## 2021-11-11 ENCOUNTER — Other Ambulatory Visit: Payer: 59

## 2021-11-11 DIAGNOSIS — Z13820 Encounter for screening for osteoporosis: Secondary | ICD-10-CM

## 2021-12-11 ENCOUNTER — Telehealth: Payer: Self-pay

## 2021-12-11 NOTE — Telephone Encounter (Signed)
PA for pt ( OZEMPIC 1 MG/DOSE )  came through via cover my meds was submitted with office notes and labs from 10/28 awaiting approval or denial ..Marland Kitchen

## 2021-12-11 NOTE — Telephone Encounter (Signed)
DECISION:  Message from Plan   PA Case: [id number], Status: Approved,   Coverage Starts on: 12/11/2021 12:00 AM, Coverage Ends on: 12/11/2022 12:00 AM.      ( COPY SENT TO THE PHARMACY ALSO )

## 2022-01-05 ENCOUNTER — Other Ambulatory Visit: Payer: Self-pay

## 2022-01-05 DIAGNOSIS — I10 Essential (primary) hypertension: Secondary | ICD-10-CM

## 2022-01-06 ENCOUNTER — Other Ambulatory Visit: Payer: Self-pay | Admitting: *Deleted

## 2022-01-06 ENCOUNTER — Other Ambulatory Visit (HOSPITAL_COMMUNITY): Payer: Self-pay

## 2022-01-06 DIAGNOSIS — I10 Essential (primary) hypertension: Secondary | ICD-10-CM

## 2022-01-06 DIAGNOSIS — E119 Type 2 diabetes mellitus without complications: Secondary | ICD-10-CM

## 2022-01-06 MED ORDER — AMLODIPINE BESYLATE 10 MG PO TABS
10.0000 mg | ORAL_TABLET | Freq: Every day | ORAL | 0 refills | Status: DC
Start: 2022-01-06 — End: 2022-03-30
  Filled 2022-01-06: qty 90, 90d supply, fill #0

## 2022-01-06 MED ORDER — SEMAGLUTIDE (1 MG/DOSE) 4 MG/3ML ~~LOC~~ SOPN
1.0000 mg | PEN_INJECTOR | SUBCUTANEOUS | 1 refills | Status: DC
  Filled 2022-01-06 – 2022-03-30 (×2): qty 3, 28d supply, fill #0
  Filled 2022-03-30: qty 15, 140d supply, fill #0

## 2022-01-06 NOTE — Telephone Encounter (Signed)
Call from pt about her medications. Stated she's having difficulty getting Ozempic. Pt is currently in [place] but will be back tomorrow or Thursday. I called [facility] and they do have Ozempic. She also wanted to know which med was discontinued at her last OV on 09/19/21- I informed her Protonix was stopped and to continue Famotidine. She asked about Amlodpine - informed pt med is currently on her med list along with Losartan for her BP but needs a refill.I asked about leg edema; she stated mainly when she's driving.

## 2022-01-13 NOTE — Telephone Encounter (Signed)
Patient sent message via my chart to contact the clinic to schedule an appointment for March.

## 2022-03-30 ENCOUNTER — Other Ambulatory Visit: Payer: Self-pay | Admitting: Internal Medicine

## 2022-03-30 ENCOUNTER — Other Ambulatory Visit (HOSPITAL_COMMUNITY): Payer: Self-pay

## 2022-03-30 DIAGNOSIS — E785 Hyperlipidemia, unspecified: Secondary | ICD-10-CM

## 2022-03-30 DIAGNOSIS — I82452 Acute embolism and thrombosis of left peroneal vein: Secondary | ICD-10-CM

## 2022-03-30 DIAGNOSIS — I10 Essential (primary) hypertension: Secondary | ICD-10-CM

## 2022-03-30 DIAGNOSIS — E119 Type 2 diabetes mellitus without complications: Secondary | ICD-10-CM

## 2022-03-30 DIAGNOSIS — E039 Hypothyroidism, unspecified: Secondary | ICD-10-CM

## 2022-03-31 ENCOUNTER — Other Ambulatory Visit (HOSPITAL_COMMUNITY): Payer: Self-pay

## 2022-03-31 MED ORDER — RIVAROXABAN 20 MG PO TABS
20.0000 mg | ORAL_TABLET | Freq: Every day | ORAL | 1 refills | Status: DC
  Filled 2022-03-31: qty 90, 90d supply, fill #0

## 2022-03-31 MED ORDER — ATORVASTATIN CALCIUM 40 MG PO TABS
40.0000 mg | ORAL_TABLET | Freq: Every day | ORAL | 3 refills | Status: DC
  Filled 2022-03-31: qty 90, 90d supply, fill #0

## 2022-03-31 MED ORDER — LEVOTHYROXINE SODIUM 50 MCG PO TABS
50.0000 ug | ORAL_TABLET | Freq: Every day | ORAL | 2 refills | Status: DC
  Filled 2022-03-31: qty 90, 90d supply, fill #0

## 2022-03-31 MED ORDER — FREESTYLE LANCETS MISC
0 refills | Status: DC
  Filled 2022-03-31: qty 100, fill #0
  Filled 2022-04-03: qty 100, 100d supply, fill #0

## 2022-03-31 MED ORDER — AMLODIPINE BESYLATE 10 MG PO TABS
10.0000 mg | ORAL_TABLET | Freq: Every day | ORAL | 0 refills | Status: DC
  Filled 2022-03-31: qty 90, 90d supply, fill #0

## 2022-04-01 LAB — HM DIABETES EYE EXAM

## 2022-04-02 ENCOUNTER — Other Ambulatory Visit: Payer: Self-pay

## 2022-04-02 ENCOUNTER — Ambulatory Visit (INDEPENDENT_AMBULATORY_CARE_PROVIDER_SITE_OTHER): Payer: Medicare Other | Admitting: Internal Medicine

## 2022-04-02 ENCOUNTER — Other Ambulatory Visit (HOSPITAL_COMMUNITY): Payer: Self-pay

## 2022-04-02 ENCOUNTER — Encounter: Payer: Self-pay | Admitting: Internal Medicine

## 2022-04-02 VITALS — BP 139/75 | HR 77 | Temp 98.1°F | Resp 28 | Ht 70.0 in | Wt 312.2 lb

## 2022-04-02 DIAGNOSIS — E785 Hyperlipidemia, unspecified: Secondary | ICD-10-CM | POA: Diagnosis not present

## 2022-04-02 DIAGNOSIS — E039 Hypothyroidism, unspecified: Secondary | ICD-10-CM

## 2022-04-02 DIAGNOSIS — Z23 Encounter for immunization: Secondary | ICD-10-CM

## 2022-04-02 DIAGNOSIS — E119 Type 2 diabetes mellitus without complications: Secondary | ICD-10-CM | POA: Diagnosis not present

## 2022-04-02 DIAGNOSIS — I10 Essential (primary) hypertension: Secondary | ICD-10-CM | POA: Diagnosis present

## 2022-04-02 DIAGNOSIS — Z7901 Long term (current) use of anticoagulants: Secondary | ICD-10-CM | POA: Diagnosis not present

## 2022-04-02 DIAGNOSIS — I82452 Acute embolism and thrombosis of left peroneal vein: Secondary | ICD-10-CM | POA: Diagnosis not present

## 2022-04-02 DIAGNOSIS — Z87891 Personal history of nicotine dependence: Secondary | ICD-10-CM

## 2022-04-02 DIAGNOSIS — K219 Gastro-esophageal reflux disease without esophagitis: Secondary | ICD-10-CM

## 2022-04-02 LAB — POCT GLYCOSYLATED HEMOGLOBIN (HGB A1C): Hemoglobin A1C: 6.2 % — AB (ref 4.0–5.6)

## 2022-04-02 LAB — GLUCOSE, CAPILLARY: Glucose-Capillary: 163 mg/dL — ABNORMAL HIGH (ref 70–99)

## 2022-04-02 MED ORDER — LOSARTAN POTASSIUM 25 MG PO TABS
25.0000 mg | ORAL_TABLET | Freq: Every day | ORAL | 1 refills | Status: DC
  Filled 2022-04-02: qty 30, 30d supply, fill #0
  Filled 2022-05-13: qty 90, 90d supply, fill #1

## 2022-04-02 MED ORDER — ATORVASTATIN CALCIUM 40 MG PO TABS
40.0000 mg | ORAL_TABLET | Freq: Every day | ORAL | 3 refills | Status: DC
  Filled 2022-04-02: qty 30, 30d supply, fill #0
  Filled 2022-05-13: qty 90, 90d supply, fill #1

## 2022-04-02 MED ORDER — FAMOTIDINE 20 MG PO TABS
20.0000 mg | ORAL_TABLET | Freq: Every day | ORAL | 2 refills | Status: DC
  Filled 2022-04-02: qty 60, 60d supply, fill #0
  Filled 2022-05-13: qty 90, 90d supply, fill #1

## 2022-04-02 MED ORDER — RIVAROXABAN 20 MG PO TABS
20.0000 mg | ORAL_TABLET | Freq: Every day | ORAL | 1 refills | Status: DC
  Filled 2022-04-02: qty 30, 30d supply, fill #0
  Filled 2022-04-06 – 2022-04-08 (×2): qty 90, 90d supply, fill #0
  Filled 2022-05-13: qty 30, 30d supply, fill #0

## 2022-04-02 MED ORDER — LEVOTHYROXINE SODIUM 50 MCG PO TABS
50.0000 ug | ORAL_TABLET | Freq: Every day | ORAL | 2 refills | Status: DC
  Filled 2022-04-02: qty 30, 30d supply, fill #0
  Filled 2022-05-13: qty 90, 90d supply, fill #1

## 2022-04-02 MED ORDER — AMLODIPINE BESYLATE 10 MG PO TABS
10.0000 mg | ORAL_TABLET | Freq: Every day | ORAL | 0 refills | Status: DC
  Filled 2022-04-02: qty 30, 30d supply, fill #0
  Filled 2022-05-13: qty 60, 60d supply, fill #1

## 2022-04-02 NOTE — Patient Instructions (Signed)
Isabel Chen, it was a pleasure seeing you today! ? ?Today we discussed: ? ?Hypertension: your blood pressure is within goal at today's appointment.  ?-Continue taking amlodipine and losartan ? ?Diabetes: Your A1C is 6.2. Keep up the good work.  ? ? ? ?Follow-up: 3 months  ? ?Please make sure to arrive 15 minutes prior to your next appointment. If you arrive late, you may be asked to reschedule.  ? ?We look forward to seeing you next time. Please call our clinic at [phone] if you have any questions or concerns. The best time to call is Monday-Friday from 9am-4pm, but there is someone available 24/7. If after hours or the weekend, call the main hospital number and ask for the Internal Medicine Resident On-Call. If you need medication refills, please notify your pharmacy one week in advance and they will send [place] a request. ? ?Thank you for letting us take part in your care. Wishing you the best! ? ? ?

## 2022-04-02 NOTE — Assessment & Plan Note (Signed)
Ozempic discontinued in January due to insurance issues.  A1c of 6.2 today suggests adequate glycemic control.  She would like to defer restarting Ozempic for now and work on lifestyle modifications.  Her diet is primarily consisting of salmon and broccoli.  She has decreased her simple carbohydrate intake. ?- We will remove Ozempic from her medication list ?- Repeat A1c in 3 months ?

## 2022-04-02 NOTE — Assessment & Plan Note (Signed)
Current medications: Amlodipine 10 mg daily, losartan 25 mg daily ?Reports medication adherence, denies adverse medication effects. ?Blood pressure is at goal in the office today-139/75 ?- Continue current management ?- BMP today ?

## 2022-04-02 NOTE — Progress Notes (Signed)
Internal Medicine Clinic Attending  Case discussed with Dr. Christian  At the time of the visit.  We reviewed the resident's history and exam and pertinent patient test results.  I agree with the assessment, diagnosis, and plan of care documented in the resident's note.  

## 2022-04-02 NOTE — Assessment & Plan Note (Signed)
Denies bleeding issues.  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 ?- Continue Xarelto ?

## 2022-04-02 NOTE — Progress Notes (Signed)
? ?  Office Visit ? ? Patient ID: Isabel Chen, female    DOB: 1956/11/04, 66 y.o.   MRN: [id number]   PCP: Carmel Sacramento, MD  ? ?Subjective:  ?CC: Medication Refill, Hypertension, Diabetes, and Hypothyroidism  ? ?Isabel Chen is a 66 y.o. year old female who presents for the above medical condition(s). Please refer to problem based charting for assessment and plan. ? ? ? ?Objective:  ? ?BP 139/75 (BP Location: Left Arm, Patient Position: Sitting, Cuff Size: Large)   Pulse 77   Temp 98.1 ?F (36.7 ?C) (Oral)   Resp (!) 28   Ht 5\' 10"  (1.778 m)   Wt (!) 312 lb 3.2 oz (141.6 kg)   SpO2 100% Comment: room air  BMI 44.80 kg/m?  ? ?General: Well-appearing obese female in no distress ?Cardiac: Heart regular rate and rhythm ?Pulm: Breathing comfortably on room air, lung sounds are clear ?GI: Soft, nondistended ?Assessment & Plan:  ? ? ? DVT (deep venous thrombosis) (HCC)  ?  Denies bleeding issues.  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 ?- Continue Xarelto ? ?  ?  ? Hypertension - Primary  ?  Current medications: Amlodipine 10 mg daily, losartan 25 mg daily ?Reports medication adherence, denies adverse medication effects. ?Blood pressure is at goal in the office today-139/75 ?- Continue current management ?- BMP today ?  ?  ? Hypothyroidism  ?  Current medication: Synthroid 50 mcg daily ?Check TSH today ?  ?  ? DM (diabetes mellitus), type 2 (HCC)  ?  Ozempic discontinued in January due to insurance issues.  A1c of 6.2 today suggests adequate glycemic control.  She would like to defer restarting Ozempic for now and work on lifestyle modifications.  Her diet is primarily consisting of salmon and broccoli.  She has decreased her simple carbohydrate intake. ?- We will remove Ozempic from her medication list ?- Repeat A1c in 3 months ? ?  ?  ? ?Pneumonia vaccination provided in the office today ?She underwent diabetic retinopathy screening yesterday.  Awaiting documentation  faxed. ? ?Return in about 3 months (around 07/03/2022). ?- Discuss DEXA screening at her next office visit ? ? ?Pt discussed with Dr. 09/02/2022 ? ?Lafonda Mosses, MD ?Internal Medicine Resident PGY-3 ?[facility] ?04/02/2022 1:23 PM  ?  ?

## 2022-04-02 NOTE — Assessment & Plan Note (Signed)
Current medication: Synthroid 50 mcg daily ?Check TSH today ?

## 2022-04-03 ENCOUNTER — Other Ambulatory Visit (HOSPITAL_COMMUNITY): Payer: Self-pay

## 2022-04-03 LAB — BASIC METABOLIC PANEL
BUN/Creatinine Ratio: 11 — ABNORMAL LOW (ref 12–28)
BUN: 11 mg/dL (ref 8–27)
CO2: 22 mmol/L (ref 20–29)
Calcium: 9.1 mg/dL (ref 8.7–10.3)
Chloride: 101 mmol/L (ref 96–106)
Creatinine, Ser: 0.98 mg/dL (ref 0.57–1.00)
Glucose: 149 mg/dL — ABNORMAL HIGH (ref 70–99)
Potassium: 3.6 mmol/L (ref 3.5–5.2)
Sodium: 142 mmol/L (ref 134–144)
eGFR: 64 mL/min/{1.73_m2} (ref >59)

## 2022-04-03 LAB — TSH: TSH: 3.8 u[IU]/mL (ref 0.450–4.500)

## 2022-04-05 NOTE — Progress Notes (Signed)
Your kidney function, electrolytes, and thyroid level were normal from your recent labs. Please let me know if you have any questions or concerns.  ? ?Mitzi Hansen, MD

## 2022-04-06 ENCOUNTER — Other Ambulatory Visit (HOSPITAL_COMMUNITY): Payer: Self-pay

## 2022-04-07 ENCOUNTER — Other Ambulatory Visit (HOSPITAL_COMMUNITY): Payer: Self-pay

## 2022-04-08 ENCOUNTER — Other Ambulatory Visit (HOSPITAL_COMMUNITY): Payer: Self-pay

## 2022-04-21 ENCOUNTER — Encounter: Payer: Self-pay | Admitting: Dietician

## 2022-05-13 ENCOUNTER — Other Ambulatory Visit: Payer: Self-pay | Admitting: Internal Medicine

## 2022-05-13 ENCOUNTER — Other Ambulatory Visit (HOSPITAL_COMMUNITY): Payer: Self-pay

## 2022-05-13 DIAGNOSIS — I10 Essential (primary) hypertension: Secondary | ICD-10-CM

## 2022-05-13 MED ORDER — AMLODIPINE BESYLATE 10 MG PO TABS
10.0000 mg | ORAL_TABLET | Freq: Every day | ORAL | 0 refills | Status: DC
  Filled 2022-05-13: qty 90, 90d supply, fill #0

## 2022-05-13 NOTE — Telephone Encounter (Signed)
Call to [facility]. Patent was only dispensed 30 tablets.  Has 60 tablets left and can go to the Pharmacy on tomorrow to pick up .  Call to patient message left to call the Clinics.

## 2022-05-14 ENCOUNTER — Other Ambulatory Visit (HOSPITAL_COMMUNITY): Payer: Self-pay

## 2022-05-16 ENCOUNTER — Encounter: Payer: Self-pay | Admitting: *Deleted

## 2022-05-27 ENCOUNTER — Other Ambulatory Visit (HOSPITAL_COMMUNITY): Payer: Self-pay

## 2022-05-29 ENCOUNTER — Other Ambulatory Visit (HOSPITAL_COMMUNITY): Payer: Self-pay

## 2022-06-16 ENCOUNTER — Emergency Department: Payer: Medicare Other

## 2022-06-16 ENCOUNTER — Telehealth: Payer: Self-pay | Admitting: *Deleted

## 2022-06-16 ENCOUNTER — Emergency Department
Admission: EM | Admit: 2022-06-16 | Discharge: 2022-06-16 | Disposition: A | Discharge status: Home / Self Care | Payer: Medicare Other | Attending: Emergency Medicine | Admitting: Emergency Medicine

## 2022-06-16 ENCOUNTER — Other Ambulatory Visit: Payer: Self-pay

## 2022-06-16 DIAGNOSIS — M7912 Myalgia of auxiliary muscles, head and neck: Secondary | ICD-10-CM | POA: Insufficient documentation

## 2022-06-16 DIAGNOSIS — R0789 Other chest pain: Secondary | ICD-10-CM | POA: Diagnosis present

## 2022-06-16 DIAGNOSIS — Z8709 Personal history of other diseases of the respiratory system: Secondary | ICD-10-CM | POA: Diagnosis not present

## 2022-06-16 DIAGNOSIS — Z8669 Personal history of other diseases of the nervous system and sense organs: Secondary | ICD-10-CM

## 2022-06-16 LAB — CBC
HCT: 34.8 % — ABNORMAL LOW (ref 36.0–46.0)
Hemoglobin: 11.3 g/dL — ABNORMAL LOW (ref 12.0–15.0)
MCH: 26.5 pg (ref 26.0–34.0)
MCHC: 32.5 g/dL (ref 30.0–36.0)
MCV: 81.7 fL (ref 80.0–100.0)
Platelets: 328 10*3/uL (ref 150–400)
RBC: 4.26 MIL/uL (ref 3.87–5.11)
RDW: 16.1 % — ABNORMAL HIGH (ref 11.5–15.5)
WBC: 8 10*3/uL (ref 4.0–10.5)
nRBC: 0 % (ref 0.0–0.2)

## 2022-06-16 LAB — COMPREHENSIVE METABOLIC PANEL
ALT: 17 U/L (ref 0–44)
AST: 17 U/L (ref 15–41)
Albumin: 4.1 g/dL (ref 3.5–5.0)
Alkaline Phosphatase: 131 U/L — ABNORMAL HIGH (ref 38–126)
Anion gap: 7 (ref 5–15)
BUN: 10 mg/dL (ref 8–23)
CO2: 26 mmol/L (ref 22–32)
Calcium: 9.2 mg/dL (ref 8.9–10.3)
Chloride: 107 mmol/L (ref 98–111)
Creatinine, Ser: 0.87 mg/dL (ref 0.44–1.00)
GFR, Estimated: >60 mL/min (ref >60)
Glucose, Bld: 96 mg/dL (ref 70–99)
Potassium: 4.2 mmol/L (ref 3.5–5.1)
Sodium: 140 mmol/L (ref 135–145)
Total Bilirubin: 0.9 mg/dL (ref 0.3–1.2)
Total Protein: 8.2 g/dL — ABNORMAL HIGH (ref 6.5–8.1)

## 2022-06-16 LAB — PROTIME-INR
INR: 1 (ref 0.8–1.2)
Prothrombin Time: 12.8 seconds (ref 11.4–15.2)

## 2022-06-16 LAB — TROPONIN I (HIGH SENSITIVITY)
Troponin I (High Sensitivity): 4 ng/L (ref <18)
Troponin I (High Sensitivity): 4 ng/L (ref <18)

## 2022-06-16 MED ORDER — IOHEXOL 350 MG/ML SOLN
75.0000 mL | Freq: Once | INTRAVENOUS | Status: AC | PRN
  Administered 2022-06-16: 75 mL via INTRAVENOUS

## 2022-06-16 NOTE — Telephone Encounter (Signed)
Patient called in c/o chest pain that goes to her back that began today; rates 9/10. Also, with left leg numbness. Patient is advised to head immediately to ED. She is advised to call 911 if there is no one to drive her. States she will.

## 2022-06-16 NOTE — ED Notes (Signed)
Patient discharged at this time. Wheeled to lobby with family per pt request.  Breathing unlabored speaking in full sentences. Verbalized understanding of all discharge and follow up teaching. Discharged homed with all belongings.

## 2022-06-16 NOTE — ED Provider Notes (Signed)
[facility] Provider Note   Event Date/Time   First MD Initiated Contact with Patient 06/16/22 1956     (approximate) History  Chest Pain  HPI Isabel Chen is a 66 y.o. female with a stated past medical history of sleep apnea who presents for generalized anterior chest wall pain that began upon awakening today and was associated with right lower extremity numbness.  Patient states that this right lower extremity numbness resolved spontaneously within the first 30 minutes of awakening.  Patient states that the chest pain has been 5/10 in severity for most of the day but has resolved spontaneously throughout the course of today.  Patient denies any associated shortness of breath but does endorse some soreness to the left anterior neck muscles.  Patient states that she intermittently wears her CPAP at night and did not wear it last night. ROS: Patient currently denies any vision changes, tinnitus, difficulty speaking, facial droop, sore throat, shortness of breath, abdominal pain, nausea/vomiting/diarrhea, dysuria, or weakness/numbness/paresthesias in any extremity   Physical Exam  Triage Vital Signs: ED Triage Vitals [06/16/22 1432]  Enc Vitals Group     BP 105/77     Pulse Rate 79     Resp 18     Temp 98.5 F (36.9 C)     Temp Source Oral     SpO2 98 %     Weight (!) 312 lb 2.7 oz (141.6 kg)     Height 5\' 10"  (1.778 m)     Head Circumference      Peak Flow      Pain Score      Pain Loc      Pain Edu?      Excl. in GC?    Most recent vital signs: Vitals:   06/16/22 1929 06/16/22 2056  BP: (!) 146/71 (!) 149/61  Pulse: 73 67  Resp: 17 16  Temp: 98.1 F (36.7 C) 98.1 F (36.7 C)  SpO2: 98% 100%   General: Awake, oriented x4. CV:  Good peripheral perfusion.  Resp:  Normal effort.  Abd:  No distention.  Other:  Elderly obese African-American female laying in bed in no acute distress ED Results / Procedures / Treatments  Labs (all labs ordered  are listed, but only abnormal results are displayed) Labs Reviewed  CBC - Abnormal; Notable for the following components:      Result Value   Hemoglobin 11.3 (*)    HCT 34.8 (*)    RDW 16.1 (*)    All other components within normal limits  COMPREHENSIVE METABOLIC PANEL - Abnormal; Notable for the following components:   Total Protein 8.2 (*)    Alkaline Phosphatase 131 (*)    All other components within normal limits  PROTIME-INR  TROPONIN I (HIGH SENSITIVITY)  TROPONIN I (HIGH SENSITIVITY)   EKG ED ECG REPORT I, 06/18/22, the attending physician, personally viewed and interpreted this ECG. Date: 06/16/2022 EKG Time: 1406 Rate: 83 Rhythm: normal sinus rhythm QRS Axis: normal Intervals: normal ST/T Wave abnormalities: normal Narrative Interpretation: Normal sinus rhythm with multiple PVCs.  No evidence of acute ischemia RADIOLOGY ED MD interpretation: CT angiography of the chest interpreted by me and is negative for any acute pulmonary emboli or aortic dissection.  Lung fields clear with mild aortic's atherosclerosis  2 view chest x-ray interpreted by me shows no evidence of acute abnormalities including no pneumonia, pneumothorax, or widened mediastinum -Agree with radiology assessment Official radiology report(s): CT Angio Chest  PE W and/or Wo Contrast  Result Date: 06/16/2022 CLINICAL DATA:  Left leg numbness left chest pain EXAM: CT ANGIOGRAPHY CHEST WITH CONTRAST TECHNIQUE: Multidetector CT imaging of the chest was performed using the standard protocol during bolus administration of intravenous contrast. Multiplanar CT image reconstructions and MIPs were obtained to evaluate the vascular anatomy. RADIATION DOSE REDUCTION: This exam was performed according to the departmental dose-optimization program which includes automated exposure control, adjustment of the mA and/or kV according to patient size and/or use of iterative reconstruction technique. CONTRAST:  68mL  OMNIPAQUE IOHEXOL 350 MG/ML SOLN COMPARISON:  Chest x-ray 06/16/2022, chest CT 10/29/2018 FINDINGS: Cardiovascular: Satisfactory opacification of the pulmonary arteries to the segmental level. No evidence of pulmonary embolism. Normal heart size. No pericardial effusion. Mediastinum/Nodes: Aorta is nonaneurysmal. Minimal atherosclerosis. No dissection is seen. No sizable pericardial effusion. Midline trachea. Status post left thyroidectomy. No suspicious lymph nodes. Esophagus within normal limits. Lungs/Pleura: No acute airspace disease, pleural effusion, or pneumothorax Upper Abdomen: No acute abnormality. Musculoskeletal: No chest wall abnormality. No acute or significant osseous findings. Review of the MIP images confirms the above findings. IMPRESSION: 1. Negative for acute pulmonary embolus or aortic dissection. 2. Clear lung fields. Aortic Atherosclerosis (ICD10-I70.0). Electronically Signed   By: Jasmine Pang M.D.   On: 06/16/2022 15:47   DG Chest 2 View  Result Date: 06/16/2022 CLINICAL DATA:  Chest pain. EXAM: CHEST - 2 VIEW COMPARISON:  AP chest 03/12/2020 FINDINGS: Cardiac silhouette and mediastinal contours are within normal limits. Mild calcification within aortic arch. The lungs are clear. No pleural effusion or pneumothorax. Mild multilevel degenerative disc changes of the thoracic spine. IMPRESSION: No active cardiopulmonary disease. Electronically Signed   By: Neita Garnet M.D.   On: 06/16/2022 14:56   PROCEDURES: Critical Care performed: No .1-3 Lead EKG Interpretation  Performed by: Merwyn Katos, MD Authorized by: Merwyn Katos, MD     Interpretation: abnormal     ECG rate:  71   ECG rate assessment: normal     Rhythm: sinus rhythm     Ectopy: PVCs     Conduction: normal    MEDICATIONS ORDERED IN ED: Medications  iohexol (OMNIPAQUE) 350 MG/ML injection 75 mL (75 mLs Intravenous Contrast Given 06/16/22 1526)   IMPRESSION / MDM / ASSESSMENT AND PLAN / ED COURSE  I  reviewed the triage vital signs and the nursing notes.                             The patient is on the cardiac monitor to evaluate for evidence of arrhythmia and/or significant heart rate changes. Patient's presentation is most consistent with acute presentation with potential threat to life or bodily function. Workup: ECG, CXR, CBC, BMP, Troponin Findings: ECG: No overt evidence of STEMI. No evidence of Brugadas sign, delta wave, epsilon wave, significantly prolonged QTc, or malignant arrhythmia HS Troponin: Negative x2 Other Labs unremarkable for emergent problems. CXR: Without PTX, PNA, or widened mediastinum CTA chest: No evidence of pulmonary emboli or aortic disease other than atherosclerosis Last Stress Test:  2021 Last Heart Catheterization:  2019 HEART Score: 5  Given History, Exam, and Workup I have low suspicion for ACS, Pneumothorax, Pneumonia, Pulmonary Embolus, Tamponade, Aortic Dissection or other emergent problem as a cause for this presentation.   Reassesment: Prior to discharge patients pain was controlled and they were well appearing.  Question whether patient's chest pain was due to negative pressure during an  apneic event prior to awakening given she does not wear her CPAP machine at home.  Patient was encouraged to wear CPAP machine every night with added melatonin in order to help her sleep through the night.  Patient agrees with plan for discharge and follow-up with her primary care physician as well as her cardiologist if symptoms persist.  Disposition:  Discharge. Strict return precautions discussed with patient with full understanding. Advised patient to follow up promptly with primary care provider     FINAL CLINICAL IMPRESSION(S) / ED DIAGNOSES   Final diagnoses:  Chest wall pain  History of sleep apnea   Rx / DC Orders   ED Discharge Orders     None      Note:  This document was prepared using Dragon voice recognition software and may include  unintentional dictation errors.   Merwyn Katos, MD 06/16/22 2142

## 2022-06-16 NOTE — ED Provider Triage Note (Signed)
Emergency Medicine Provider Triage Evaluation Note  EDDIS PINGLETON , a 66 y.o. female  was evaluated in triage.  Pt complains of chest pain, awoke with this am.  Been out of xeralto for over a month and hx of blood clots.  Review of Systems  Positive: Cp, sob  Negative: fever  Physical Exam  BP 105/77 (BP Location: Left Arm)   Pulse 79   Temp 98.5 F (36.9 C) (Oral)   Resp 18   Ht 5\' 10"  (1.778 m)   Wt (!) 141.6 kg   SpO2 98%   BMI 44.79 kg/m  Gen:   Awake, no distress   Resp:  Normal effort  MSK:   Moves extremities without difficulty  Other:    Medical Decision Making  Medically screening exam initiated at 2:35 PM.  Appropriate orders placed.  was informed that the remainder of the evaluation will be completed by another provider, this initial triage assessment does not replace that evaluation, and the importance of remaining in the ED until their evaluation is complete.  Protocols for cp started, also cta for pe with patients hx   Jones Bales, PA-C 06/16/22 1436

## 2022-06-16 NOTE — ED Triage Notes (Signed)
Pt here with CP, SOB, and left leg numbness. Pt states the leg numbness has resolved. Pt states the CP is left sided and radiates to her neck. Pt describes the pain as crushing and is constant.

## 2022-06-16 NOTE — Discharge Instructions (Addendum)
Please use melatonin 10 mg p.o. before bedtime every night

## 2022-07-22 ENCOUNTER — Other Ambulatory Visit: Payer: Self-pay | Admitting: *Deleted

## 2022-07-22 DIAGNOSIS — I10 Essential (primary) hypertension: Secondary | ICD-10-CM

## 2022-07-22 DIAGNOSIS — I82452 Acute embolism and thrombosis of left peroneal vein: Secondary | ICD-10-CM

## 2022-07-22 MED ORDER — RIVAROXABAN 20 MG PO TABS: 20.0000 mg | ORAL_TABLET | Freq: Every day | ORAL | 2 refills | Status: DC

## 2022-07-22 MED ORDER — AMLODIPINE BESYLATE 10 MG PO TABS: 10.0000 mg | ORAL_TABLET | Freq: Every day | ORAL | 2 refills | Status: DC

## 2022-07-22 MED ORDER — LOSARTAN POTASSIUM 25 MG PO TABS: 25.0000 mg | ORAL_TABLET | Freq: Every day | ORAL | 2 refills | Status: DC

## 2022-12-28 ENCOUNTER — Ambulatory Visit (HOSPITAL_COMMUNITY)
Admission: RE | Admit: 2022-12-28 | Discharge: 2022-12-28 | Disposition: A | Discharge status: Home / Self Care | Payer: Self-pay | Source: Ambulatory Visit | Attending: Internal Medicine | Admitting: Internal Medicine

## 2022-12-28 ENCOUNTER — Ambulatory Visit (INDEPENDENT_AMBULATORY_CARE_PROVIDER_SITE_OTHER): Payer: Self-pay | Admitting: Student

## 2022-12-28 ENCOUNTER — Other Ambulatory Visit (HOSPITAL_COMMUNITY): Payer: Self-pay

## 2022-12-28 VITALS — BP 138/63 | HR 77 | Temp 98.6°F | Ht 70.0 in | Wt 326.1 lb

## 2022-12-28 DIAGNOSIS — E119 Type 2 diabetes mellitus without complications: Secondary | ICD-10-CM

## 2022-12-28 DIAGNOSIS — G4733 Obstructive sleep apnea (adult) (pediatric): Secondary | ICD-10-CM

## 2022-12-28 DIAGNOSIS — Z86718 Personal history of other venous thrombosis and embolism: Secondary | ICD-10-CM

## 2022-12-28 DIAGNOSIS — Z87891 Personal history of nicotine dependence: Secondary | ICD-10-CM

## 2022-12-28 DIAGNOSIS — Z7901 Long term (current) use of anticoagulants: Secondary | ICD-10-CM

## 2022-12-28 DIAGNOSIS — I499 Cardiac arrhythmia, unspecified: Secondary | ICD-10-CM | POA: Insufficient documentation

## 2022-12-28 DIAGNOSIS — E559 Vitamin D deficiency, unspecified: Secondary | ICD-10-CM

## 2022-12-28 DIAGNOSIS — E039 Hypothyroidism, unspecified: Secondary | ICD-10-CM

## 2022-12-28 DIAGNOSIS — I10 Essential (primary) hypertension: Secondary | ICD-10-CM

## 2022-12-28 DIAGNOSIS — I82452 Acute embolism and thrombosis of left peroneal vein: Secondary | ICD-10-CM

## 2022-12-28 MED ORDER — RIVAROXABAN 20 MG PO TABS
20.0000 mg | ORAL_TABLET | Freq: Every day | ORAL | 3 refills | Status: DC
  Filled 2022-12-28: qty 30, 30d supply, fill #0
  Filled 2023-01-22: qty 30, 30d supply, fill #1
  Filled 2023-02-25: qty 30, 30d supply, fill #2
  Filled 2023-03-16 – 2023-03-17 (×2): qty 30, 30d supply, fill #3
  Filled 2023-04-15 (×2): qty 30, 30d supply, fill #4
  Filled 2023-05-17 – 2023-05-18 (×2): qty 30, 30d supply, fill #5
  Filled 2023-06-17: qty 30, 30d supply, fill #6
  Filled 2023-07-23: qty 30, 30d supply, fill #7
  Filled 2023-09-13: qty 30, 30d supply, fill #8

## 2022-12-28 NOTE — Progress Notes (Unsigned)
   CC: Follow-up  HPI:  Ms.Haila I Flight is a 67 y.o. female with PMH as below who presents to clinic to follow-up on her chronic medical problems. Please see problem based charting for evaluation, assessment and plan.  Past Medical History:  Diagnosis Date   Arthritis    GERD (gastroesophageal reflux disease)    Heart murmur yrs ago   History of bronchitis last 2016   Pneumonia 2010   Thyroid disease     Review of Systems:  Constitutional: Positive for occasional fatigue. Eyes: Negative for visual changes Respiratory: Negative for shortness of breath Cardiac: Negative for chest pain or palpitations. MSK: Positive for occasional knee pain. Negative for leg swelling. Abdomen: Negative for abdominal pain, constipation or diarrhea Neuro: Negative for headache or weakness  Physical Exam: General: Pleasant, obese elderly woman. No acute distress. Cardiac: Regular rate. Irregular rhythm. No murmurs, rubs or gallops. No LE edema Respiratory: Lungs CTAB. No wheezing or crackles. Abdominal: Soft, symmetric and non tender. Normal BS. Skin: Warm, dry and intact without rashes or lesions MSK: Mild ttp of bilateral knee, no crepitus or effusion. No calf tenderness. Radial and DP pulses 2+ and symmetric. Neuro: A&O x 3. Moves all extremities.  Normal sensation to gross touch Psych: Appropriate mood and affect.  Vitals:   12/28/22 0835  BP: 138/63  Pulse: 77  Temp: 98.6 F (37 C)  TempSrc: Oral  SpO2: 100%  Weight: (!) 326 lb 1.6 oz (147.9 kg)  Height: 5\' 10"  (1.778 m)    Assessment & Plan:   No problem-specific Assessment & Plan notes found for this encounter.   History of DVT  Irregular rhythm  See Encounters Tab for problem based charting.  Patient discussed with Dr.  Dani Gobble, MD, MPH

## 2022-12-28 NOTE — Patient Instructions (Addendum)
Thank you, Isabel Chen I [place] for allowing [place] to provide your care today. Today we discussed your diabetes, hypothyroidism, vitamin D deficiency, sleep apnea, blood pressure and EKG.    I want you to resume your Xarelto so I have sent refills to our pharmacy for you to pick it up.  Your EKG showed an abnormal heart rhythm called PVCs. They are not concerning unless you start having symptoms such as dizziness, blurry vision or palpitations.  They can be caused by electrolyte abnormalities, high blood pressure, smoking, stress or excessive intake of caffeine.  I am checking electrolytes but also encourage you to use a CPAP at home.  I have ordered the following labs for you:  Lab Orders         TSH         Vitamin D (25 hydroxy)         Hemoglobin A1c         BMP8+Anion Gap         Magnesium      I will call if any are abnormal. All of your labs can be accessed through "My Chart".  My Chart Access: [email or web address]  Please follow-up in 3 months  Please make sure to arrive 15 minutes prior to your next appointment. If you arrive late, you may be asked to reschedule.    We look forward to seeing you next time. Please call our clinic at [phone] if you have any questions or concerns. The best time to call is Monday-Friday from 9am-4pm, but there is someone available 24/7. If after hours or the weekend, call the main hospital number and ask for the Internal Medicine Resident On-Call. If you need medication refills, please notify your pharmacy one week in advance and they will send [place] a request.   Thank you for letting us take part in your care. Wishing you the best!  Lacinda Axon, MD 12/28/2022, 9:30 AM IM Resident, PGY-3 [facility] 41:10

## 2022-12-29 LAB — VITAMIN D 25 HYDROXY (VIT D DEFICIENCY, FRACTURES): Vit D, 25-Hydroxy: 10.7 ng/mL — ABNORMAL LOW (ref 30.0–100.0)

## 2022-12-29 LAB — TSH: TSH: 1.83 u[IU]/mL (ref 0.450–4.500)

## 2022-12-29 LAB — BMP8+ANION GAP
Anion Gap: 16 mmol/L (ref 10.0–18.0)
BUN/Creatinine Ratio: 15 (ref 12–28)
BUN: 14 mg/dL (ref 8–27)
CO2: 23 mmol/L (ref 20–29)
Calcium: 9.6 mg/dL (ref 8.7–10.3)
Chloride: 106 mmol/L (ref 96–106)
Creatinine, Ser: 0.92 mg/dL (ref 0.57–1.00)
Glucose: 132 mg/dL — ABNORMAL HIGH (ref 70–99)
Potassium: 4.1 mmol/L (ref 3.5–5.2)
Sodium: 145 mmol/L — ABNORMAL HIGH (ref 134–144)
eGFR: 69 mL/min/{1.73_m2} (ref >59)

## 2022-12-29 LAB — HEMOGLOBIN A1C
Est. average glucose Bld gHb Est-mCnc: 140 mg/dL
Hgb A1c MFr Bld: 6.5 % — ABNORMAL HIGH (ref 4.8–5.6)

## 2022-12-29 LAB — MAGNESIUM: Magnesium: 2 mg/dL (ref 1.6–2.3)

## 2022-12-30 ENCOUNTER — Encounter: Payer: Self-pay | Admitting: Student

## 2022-12-30 DIAGNOSIS — I499 Cardiac arrhythmia, unspecified: Secondary | ICD-10-CM | POA: Insufficient documentation

## 2022-12-30 MED ORDER — VITAMIN D (ERGOCALCIFEROL) 1.25 MG (50000 UNIT) PO CAPS: 50000.0000 [IU] | ORAL_CAPSULE | ORAL | 0 refills | Status: AC

## 2022-12-30 NOTE — Assessment & Plan Note (Signed)
A1c 6.5%, currently at goal but slightly elevated from 6.2% last year. 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 -Repeat A1c in 3 months

## 2022-12-30 NOTE — Assessment & Plan Note (Signed)
TSH today is normal at 1.83. -Continue levothyroxine 50 mcg daily

## 2022-12-30 NOTE — Assessment & Plan Note (Signed)
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 On exam, patient has normal rate but irregular rhythm. She denies any headaches, dizziness, chest pain, palpitations or syncope.  EKG shows normal sinus with a rate of 76 and PVCs unchanged compared to EKG in July 2023. Labs showed normal TSH, magnesium, potassium and calcium. -Counseled on limiting her intake of caffeine

## 2022-12-30 NOTE — Assessment & Plan Note (Signed)
Patient with a history of vitamin D deficiency last vitamin D 19.4 one year ago. Patient was prescribed daily vitamin D 800 units however patient states she has not taking this the last few months.  Vitamin D levels low at 10.7 today. Discussed with patient plan to initiate high-dose vitamin D for the next 8 weeks then follow-up for recheck.  Plan: -Start vitamin D 50,000 units weekly x 8 weeks -Follow-up in 2 to 3 months for recheck

## 2022-12-30 NOTE — Progress Notes (Signed)
Her A1c remains at goal. Her vitamin D has decreased compared to last year. Kidney function, magnesium and TSH within normal limits. Discussed with patient my plan to send her high-dose vitamin D for the next 8 weeks before rechecking.  I reemphasized the importance of lifestyle modifications and ensuring her A1c remains at goal. States she was able to pick up the Xarelto at the [facility] and it only cost her $10 for a 30-day supply. She was able to stay on her CPAP for about 6 hours last night.

## 2022-12-30 NOTE — Assessment & Plan Note (Addendum)
BP is stable with SBP in the 130s. Patient reports adherence to her antihypertensive medications. Denies any headaches, blurry vision or dizziness. BMP today shows normal kidney function and no electrolyte abnormalities.  Patient advised to use her CPAP nightly. Vitals:   12/28/22 0835  BP: 138/63   Plan: -Continue amlodipine 10 mg daily -Continue losartan 25 mg daily

## 2022-12-30 NOTE — Progress Notes (Signed)
Internal Medicine Clinic Attending  Case discussed with Dr. A[place]onsah  At the time of the visit.  We reviewed the resident's history and exam and pertinent patient test results.  I agree with the assessment, diagnosis, and plan of care documented in the resident's note.  

## 2022-12-30 NOTE — Assessment & Plan Note (Signed)
Patient has history of sleep apnea previously on CPAP. Patient reports that for the last few weeks she has been unable to use her CPAP machine due to the difficulty with the mask fitting appropriately. She has changed her mask previously due to similar concerns. Advised patient to reach out to her CPAP company for proper fitting.  -Continue CPAP

## 2022-12-30 NOTE — Assessment & Plan Note (Addendum)
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 States she was enrolled in Medicare Part A and lost her insurance late last year. Due to this, the cost of her Xarelto went up to $700 so she has not been able to refill her Xarelto for the past 3 months. She denies any lower extremity pain or swelling. Discussed plan to send her Xarelto to [place] for now and advised patient to call the Social Security to discuss her Medicare coverage as patient interested in switching back to her private insurance. States she was only paying about $10 for her Xarelto prescriptions. There is no calf tenderness or lower extremity edema on exam.  Plan: -Resume Xarelto 20 mg daily -Follow-up in 1 month

## 2023-01-27 ENCOUNTER — Other Ambulatory Visit: Payer: Self-pay

## 2023-01-27 ENCOUNTER — Other Ambulatory Visit (HOSPITAL_COMMUNITY): Payer: Self-pay

## 2023-01-28 ENCOUNTER — Other Ambulatory Visit: Payer: Self-pay

## 2023-02-23 ENCOUNTER — Ambulatory Visit (INDEPENDENT_AMBULATORY_CARE_PROVIDER_SITE_OTHER): Payer: Self-pay

## 2023-02-23 ENCOUNTER — Other Ambulatory Visit: Payer: Self-pay

## 2023-02-23 VITALS — BP 127/64 | HR 76 | Temp 98.1°F | Ht 70.0 in | Wt 300.2 lb

## 2023-02-23 DIAGNOSIS — Z7901 Long term (current) use of anticoagulants: Secondary | ICD-10-CM

## 2023-02-23 DIAGNOSIS — E119 Type 2 diabetes mellitus without complications: Secondary | ICD-10-CM

## 2023-02-23 DIAGNOSIS — I10 Essential (primary) hypertension: Secondary | ICD-10-CM

## 2023-02-23 DIAGNOSIS — E559 Vitamin D deficiency, unspecified: Secondary | ICD-10-CM

## 2023-02-23 DIAGNOSIS — Z86718 Personal history of other venous thrombosis and embolism: Secondary | ICD-10-CM

## 2023-02-23 NOTE — Progress Notes (Signed)
   CC: 2 month f/u  HPI:  Isabel Chen is a 67 y.o. with past medical history as below who presents for follow-up of her vitamin D deficiency.  See detailed assessment and plan for HPI.  Past Medical History:  Diagnosis Date   Arthritis    GERD (gastroesophageal reflux disease)    Heart murmur yrs ago   History of bronchitis last 2016   Pneumonia 2010   Thyroid disease    Review of Systems: See detailed assessment and plan for pertinent ROS.  Physical Exam:  Vitals:   02/23/23 1004 02/23/23 1005  BP:  127/64  Pulse:  76  Temp:  98.1 F (36.7 C)  TempSrc:  Oral  SpO2:  100%  Weight: (!) 300 lb 3.2 oz (136.2 kg)   Height: 5\' 10"  (1.778 m)    Physical Exam Constitutional:      General: She is not in acute distress. Eyes:     Extraocular Movements: Extraocular movements intact.  Pulmonary:     Effort: Pulmonary effort is normal.  Musculoskeletal:     Right lower leg: No edema.     Left lower leg: No edema.     Comments: Bilateraly posterior knee pain. No palpable cord. No erythema, excessive warmth, or other skin changes.  Skin:    General: Skin is warm and dry.  Neurological:     Mental Status: She is alert and oriented to person, place, and time.  Psychiatric:        Mood and Affect: Mood normal.        Behavior: Behavior normal.      Assessment & Plan:   See Encounters Tab for problem based charting.  Vitamin D deficiency Patient presents with history of vitamin D deficiency.  Vitamin D level 10.7 at last visit.  Patient placed on vitamin D 50,000 units weekly for an 8-week course which she has completed.  Calcium level normal.  DEXA scan normal. 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  She reports  taking Xarelto 20 mg daily.  She denies any severe worsening in leg pain or swelling.  She does have some bilateral posterior knee pain.  Low suspicion that she is experiencing anticoagulation failure given history and exam. We discussed return precautions. -Continue Xarelto 20 mg daily -Repeat CBC given chronic anticoagulation    Patient discussed with Dr. Philipp Ovens

## 2023-02-23 NOTE — Patient Instructions (Addendum)
Isabel Chen, it was a pleasure seeing you today!  Today we discussed: Vitamin D - Will call with test results and discuss plan Blood clots - Continue Xarelto 20 mg daily. Call clinic if you have worsening of swelling or severe pain.  I have ordered the following labs today:  Lab Orders  No laboratory test(s) ordered today     Tests ordered today:  none  Referrals ordered today:   Referral Orders  No referral(s) requested today     I have ordered the following medication/changed the following medications:   Stop the following medications: There are no discontinued medications.   Start the following medications: No orders of the defined types were placed in this encounter.    Follow-up:  1 month    Please make sure to arrive 15 minutes prior to your next appointment. If you arrive late, you may be asked to reschedule.   We look forward to seeing you next time. Please call our clinic at [phone] if you have any questions or concerns. The best time to call is Monday-Friday from 9am-4pm, but there is someone available 24/7. If after hours or the weekend, call the main hospital number and ask for the Internal Medicine Resident On-Call. If you need medication refills, please notify your pharmacy one week in advance and they will send [place] a request.  Thank you for letting us take part in your care. Wishing you the best!  Thank you, Linward Natal, MD

## 2023-02-23 NOTE — Assessment & Plan Note (Signed)
Patient presents with history of vitamin D deficiency.  Vitamin D level 10.7 at last visit.  Patient placed on vitamin D 50,000 units weekly for an 8-week course which she has completed.  Calcium level normal.  DEXA scan normal. -Vitamin D level, will call with results supplement as indicated

## 2023-02-23 NOTE — Assessment & Plan Note (Addendum)
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  She reports taking Xarelto 20 mg daily.  She denies any severe worsening in leg pain or swelling.  She does have some bilateral posterior knee pain.  Low suspicion that she is experiencing anticoagulation failure given history and exam. We discussed return precautions. -Continue Xarelto 20 mg daily -Repeat CBC given chronic anticoagulation

## 2023-02-24 LAB — CBC
Hematocrit: 34.9 % (ref 34.0–46.6)
Hemoglobin: 11.6 g/dL (ref 11.1–15.9)
MCH: 28 pg (ref 26.6–33.0)
MCHC: 33.2 g/dL (ref 31.5–35.7)
MCV: 84 fL (ref 79–97)
Platelets: 303 10*3/uL (ref 150–450)
RBC: 4.14 x10E6/uL (ref 3.77–5.28)
RDW: 17.3 % — ABNORMAL HIGH (ref 11.7–15.4)
WBC: 8.6 10*3/uL (ref 3.4–10.8)

## 2023-02-24 LAB — MICROALBUMIN / CREATININE URINE RATIO
Creatinine, Urine: 87.6 mg/dL
Microalb/Creat Ratio: 6 mg/g creat (ref 0–29)
Microalbumin, Urine: 5.1 ug/mL

## 2023-02-24 LAB — VITAMIN D 25 HYDROXY (VIT D DEFICIENCY, FRACTURES): Vit D, 25-Hydroxy: 54.7 ng/mL (ref 30.0–100.0)

## 2023-02-24 MED ORDER — CHOLECALCIFEROL 20 MCG (800 UNIT) PO TABS
20.0000 ug | ORAL_TABLET | Freq: Every day | ORAL | 2 refills | Status: DC
Start: 2023-02-24 — End: 2023-03-15

## 2023-02-24 NOTE — Progress Notes (Signed)
Internal Medicine Clinic Attending  Case discussed with Dr. White  At the time of the visit.  We reviewed the resident's history and exam and pertinent patient test results.  I agree with the assessment, diagnosis, and plan of care documented in the resident's note.  

## 2023-02-24 NOTE — Addendum Note (Signed)
Addended by: Linward Natal on: 02/24/2023 08:56 AM   Modules accepted: Orders

## 2023-02-25 ENCOUNTER — Other Ambulatory Visit (HOSPITAL_COMMUNITY): Payer: Self-pay

## 2023-03-15 ENCOUNTER — Other Ambulatory Visit: Payer: Self-pay | Admitting: Internal Medicine

## 2023-03-15 ENCOUNTER — Other Ambulatory Visit: Payer: Self-pay

## 2023-03-15 ENCOUNTER — Other Ambulatory Visit (HOSPITAL_COMMUNITY): Payer: Self-pay

## 2023-03-15 DIAGNOSIS — E039 Hypothyroidism, unspecified: Secondary | ICD-10-CM

## 2023-03-15 DIAGNOSIS — K219 Gastro-esophageal reflux disease without esophagitis: Secondary | ICD-10-CM

## 2023-03-15 DIAGNOSIS — E785 Hyperlipidemia, unspecified: Secondary | ICD-10-CM

## 2023-03-15 DIAGNOSIS — I10 Essential (primary) hypertension: Secondary | ICD-10-CM

## 2023-03-15 MED ORDER — FAMOTIDINE 20 MG PO TABS
20.0000 mg | ORAL_TABLET | Freq: Every day | ORAL | 2 refills | Status: DC
  Filled 2023-03-15: qty 30, 30d supply, fill #0
  Filled 2023-04-12: qty 30, 30d supply, fill #1
  Filled 2023-05-17: qty 30, 30d supply, fill #2
  Filled 2023-06-17: qty 30, 30d supply, fill #3
  Filled 2023-07-23: qty 30, 30d supply, fill #4
  Filled 2023-09-13: qty 30, 30d supply, fill #5

## 2023-03-15 MED ORDER — LOSARTAN POTASSIUM 25 MG PO TABS
25.0000 mg | ORAL_TABLET | Freq: Every day | ORAL | 1 refills | Status: DC
  Filled 2023-03-15: qty 90, 90d supply, fill #0

## 2023-03-15 MED ORDER — LOSARTAN POTASSIUM 25 MG PO TABS
25.0000 mg | ORAL_TABLET | Freq: Every day | ORAL | 2 refills | Status: DC
  Filled 2023-03-17: qty 90, 90d supply, fill #0
  Filled 2023-04-15 – 2023-05-18 (×3): qty 90, 90d supply, fill #1
  Filled 2023-06-17: qty 30, 30d supply, fill #1
  Filled 2023-07-23: qty 30, 30d supply, fill #2
  Filled 2023-09-13: qty 30, 30d supply, fill #3
  Filled 2023-10-25: qty 30, 30d supply, fill #4

## 2023-03-15 MED ORDER — AMLODIPINE BESYLATE 10 MG PO TABS
10.0000 mg | ORAL_TABLET | Freq: Every day | ORAL | 0 refills | Status: DC
  Filled 2023-03-17: qty 90, 90d supply, fill #0

## 2023-03-15 MED ORDER — AMLODIPINE BESYLATE 10 MG PO TABS
10.0000 mg | ORAL_TABLET | Freq: Every day | ORAL | 0 refills | Status: DC
  Filled 2023-03-15: qty 90, 90d supply, fill #0

## 2023-03-15 MED ORDER — LEVOTHYROXINE SODIUM 50 MCG PO TABS
50.0000 ug | ORAL_TABLET | Freq: Every day | ORAL | 2 refills | Status: DC
  Filled 2023-03-15: qty 30, 30d supply, fill #0
  Filled 2023-04-12: qty 30, 30d supply, fill #1
  Filled 2023-05-17: qty 30, 30d supply, fill #2
  Filled 2023-06-17: qty 30, 30d supply, fill #3
  Filled 2023-07-23: qty 30, 30d supply, fill #4
  Filled 2023-09-13: qty 30, 30d supply, fill #5
  Filled 2023-10-25: qty 30, 30d supply, fill #6

## 2023-03-15 MED ORDER — CHOLECALCIFEROL 20 MCG (800 UNIT) PO TABS
20.0000 ug | ORAL_TABLET | Freq: Every day | ORAL | 2 refills | Status: AC
  Filled 2023-03-15: qty 30, fill #0
  Filled 2023-03-16: qty 30, 30d supply, fill #0
  Filled 2023-04-12 – 2023-05-18 (×5): qty 30, fill #0

## 2023-03-15 MED ORDER — ATORVASTATIN CALCIUM 40 MG PO TABS
40.0000 mg | ORAL_TABLET | Freq: Every day | ORAL | 3 refills | Status: DC
  Filled 2023-03-15: qty 30, 30d supply, fill #0
  Filled 2023-04-12: qty 30, 30d supply, fill #1
  Filled 2023-05-17: qty 30, 30d supply, fill #2
  Filled 2023-06-17: qty 30, 30d supply, fill #3
  Filled 2023-07-23: qty 30, 30d supply, fill #4
  Filled 2023-09-13: qty 30, 30d supply, fill #5
  Filled 2023-10-25: qty 30, 30d supply, fill #6

## 2023-03-15 NOTE — Addendum Note (Signed)
Addended by: Adron Bene on: 03/15/2023 03:08 PM   Modules accepted: Orders

## 2023-03-16 ENCOUNTER — Other Ambulatory Visit (HOSPITAL_COMMUNITY): Payer: Self-pay

## 2023-03-16 ENCOUNTER — Other Ambulatory Visit: Payer: Self-pay

## 2023-03-16 DIAGNOSIS — I10 Essential (primary) hypertension: Secondary | ICD-10-CM

## 2023-03-17 ENCOUNTER — Other Ambulatory Visit (HOSPITAL_COMMUNITY): Payer: Self-pay

## 2023-03-18 ENCOUNTER — Other Ambulatory Visit (HOSPITAL_COMMUNITY): Payer: Self-pay

## 2023-03-19 ENCOUNTER — Other Ambulatory Visit (HOSPITAL_COMMUNITY): Payer: Self-pay

## 2023-04-12 ENCOUNTER — Other Ambulatory Visit: Payer: Self-pay

## 2023-04-12 ENCOUNTER — Other Ambulatory Visit (HOSPITAL_COMMUNITY): Payer: Self-pay

## 2023-04-15 ENCOUNTER — Other Ambulatory Visit (HOSPITAL_COMMUNITY): Payer: Self-pay

## 2023-04-15 ENCOUNTER — Other Ambulatory Visit: Payer: Self-pay

## 2023-04-15 DIAGNOSIS — I10 Essential (primary) hypertension: Secondary | ICD-10-CM

## 2023-04-15 MED ORDER — AMLODIPINE BESYLATE 10 MG PO TABS
10.0000 mg | ORAL_TABLET | Freq: Every day | ORAL | 0 refills | Status: DC
  Filled 2023-04-15 – 2023-06-17 (×5): qty 90, 90d supply, fill #0

## 2023-05-18 ENCOUNTER — Other Ambulatory Visit: Payer: Self-pay

## 2023-05-18 ENCOUNTER — Other Ambulatory Visit (HOSPITAL_COMMUNITY): Payer: Self-pay

## 2023-05-20 ENCOUNTER — Other Ambulatory Visit (HOSPITAL_COMMUNITY): Payer: Self-pay

## 2023-06-17 ENCOUNTER — Other Ambulatory Visit (HOSPITAL_COMMUNITY): Payer: Self-pay

## 2023-06-18 ENCOUNTER — Other Ambulatory Visit: Payer: Self-pay

## 2023-06-28 ENCOUNTER — Telehealth: Payer: Self-pay

## 2023-06-28 ENCOUNTER — Other Ambulatory Visit (HOSPITAL_COMMUNITY): Payer: Self-pay

## 2023-06-28 NOTE — Transitions of Care (Post Inpatient/ED Visit) (Signed)
   06/28/2023  Name: Isabel Chen MRN: [id number] DOB: [birth year]  Today's TOC FU Call Status: Today's TOC FU Call Status:: Successful TOC FU Call Completed TOC FU Call Complete Date: 06/28/23  Transition Care Management Follow-up Telephone Call Date of Discharge: 06/25/23 Discharge Facility: Other ([facility]) Name of Other ([facility]) Discharge Facility: [facility] Type of Discharge: Inpatient Admission Primary Inpatient Discharge Diagnosis:: dizziness How have you been since you were released from the hospital?: Better Any questions or concerns?: No  Items Reviewed: Did you receive and understand the discharge instructions provided?: Yes Medications obtained,verified, and reconciled?: Yes (Medications Reviewed) Any new allergies since your discharge?: No Dietary orders reviewed?: Yes Do you have support at home?: No  Medications Reviewed Today: Medications Reviewed Today     Reviewed by Karena Addison, LPN (Licensed Practical Nurse) on 06/28/23 at 1107  Med List Status: <None>   Medication Order Taking? Sig Documenting Provider Last Dose Status Informant  amLODipine (NORVASC) 10 MG tablet [id number]  Take 1 tablet (10 mg total) by mouth daily. Modena Slater, DO  Active   atorvastatin (LIPITOR) 40 MG tablet [id number]  Take 1 tablet (40 mg total) by mouth daily at 6 PM. Adron Bene, MD  Active   Blood Glucose Monitoring Suppl (FREESTYLE LITE) w/Device KIT [id number] No Use as directed Inez Catalina, MD 09/25/2021 Active   camphor-menthol [facility]) lotion [id number] No Apply 1 application topically as needed for itching. Remo Lipps, MD Unknown Active   famotidine (PEPCID) 20 MG tablet [id number]  Take 1 tablet (20 mg total) by mouth at bedtime. Adron Bene, MD  Active   levothyroxine (SYNTHROID) 50 MCG tablet [id number]  Take 1 tablet (50 mcg total) by mouth daily. Adron Bene, MD  Active   losartan (COZAAR) 25 MG tablet [id number]  Take 1 tablet (25 mg  total) by mouth daily. Adron Bene, MD  Active   rivaroxaban (XARELTO) 20 MG TABS tablet [id number]  Take 1 tablet (20 mg total) by mouth daily with supper. Steffanie Rainwater, MD  Active             Home Care and Equipment/Supplies: Were Home Health Services Ordered?: NA Any new equipment or medical supplies ordered?: NA  Functional Questionnaire: Do you need assistance with bathing/showering or dressing?: No Do you need assistance with meal preparation?: No Do you need assistance with eating?: No Do you have difficulty maintaining continence: No Do you need assistance with getting out of bed/getting out of a chair/moving?: No Do you have difficulty managing or taking your medications?: No  Follow up appointments reviewed: PCP Follow-up appointment confirmed?: No (declined appt, patient [place] of state) MD Provider Line [phone] Given: No Specialist Hospital Follow-up appointment confirmed?: NA Do you need transportation to your follow-up appointment?: No Do you understand care options if your condition(s) worsen?: Yes-patient verbalized understanding    SIGNATURE Karena Addison, LPN [facility] Nurse Health Advisor Direct Dial [phone]

## 2023-07-22 ENCOUNTER — Other Ambulatory Visit (HOSPITAL_COMMUNITY): Payer: Self-pay

## 2023-07-23 ENCOUNTER — Other Ambulatory Visit (HOSPITAL_COMMUNITY): Payer: Self-pay

## 2023-07-29 ENCOUNTER — Telehealth: Payer: Self-pay

## 2023-07-29 NOTE — Telephone Encounter (Signed)
Please initiate PAP for patient ([facility] - uninsured)  Switching from IM program.

## 2023-08-09 NOTE — Telephone Encounter (Signed)
Patient address out of [place].   Will contact patient.

## 2023-09-13 ENCOUNTER — Other Ambulatory Visit (HOSPITAL_COMMUNITY): Payer: Self-pay

## 2023-09-13 ENCOUNTER — Other Ambulatory Visit: Payer: Self-pay | Admitting: Student

## 2023-09-13 DIAGNOSIS — I10 Essential (primary) hypertension: Secondary | ICD-10-CM

## 2023-09-14 ENCOUNTER — Other Ambulatory Visit (HOSPITAL_COMMUNITY): Payer: Self-pay

## 2023-09-14 MED ORDER — AMLODIPINE BESYLATE 10 MG PO TABS
10.0000 mg | ORAL_TABLET | Freq: Every day | ORAL | 0 refills | Status: DC
  Filled 2023-09-14: qty 90, 90d supply, fill #0

## 2023-09-15 ENCOUNTER — Other Ambulatory Visit (HOSPITAL_COMMUNITY): Payer: Self-pay

## 2023-09-17 NOTE — Telephone Encounter (Signed)
Spoke with patient regarding shipping address. Pt is ok with having application sent to her daughter Isabel Chen) address: [address] [place] [address]

## 2023-10-05 ENCOUNTER — Encounter: Payer: Self-pay | Admitting: Student

## 2023-10-19 NOTE — Telephone Encounter (Signed)
Mail returned to sender.  Will re-mail with daughters name on envelope.

## 2023-10-25 ENCOUNTER — Other Ambulatory Visit (HOSPITAL_COMMUNITY): Payer: Self-pay

## 2023-10-26 ENCOUNTER — Encounter: Payer: Self-pay | Admitting: Student

## 2023-10-27 ENCOUNTER — Other Ambulatory Visit (HOSPITAL_COMMUNITY): Payer: Self-pay

## 2023-11-19 ENCOUNTER — Other Ambulatory Visit: Payer: Self-pay | Admitting: Internal Medicine

## 2023-11-19 ENCOUNTER — Ambulatory Visit (INDEPENDENT_AMBULATORY_CARE_PROVIDER_SITE_OTHER): Payer: Self-pay | Admitting: Internal Medicine

## 2023-11-19 ENCOUNTER — Other Ambulatory Visit (HOSPITAL_COMMUNITY): Payer: Self-pay

## 2023-11-19 VITALS — BP 133/60 | Temp 98.1°F | Ht 70.0 in | Wt 332.0 lb

## 2023-11-19 DIAGNOSIS — I10 Essential (primary) hypertension: Secondary | ICD-10-CM

## 2023-11-19 DIAGNOSIS — M791 Myalgia, unspecified site: Secondary | ICD-10-CM

## 2023-11-19 DIAGNOSIS — K219 Gastro-esophageal reflux disease without esophagitis: Secondary | ICD-10-CM

## 2023-11-19 DIAGNOSIS — E785 Hyperlipidemia, unspecified: Secondary | ICD-10-CM

## 2023-11-19 DIAGNOSIS — E119 Type 2 diabetes mellitus without complications: Secondary | ICD-10-CM

## 2023-11-19 DIAGNOSIS — Z86718 Personal history of other venous thrombosis and embolism: Secondary | ICD-10-CM

## 2023-11-19 DIAGNOSIS — E039 Hypothyroidism, unspecified: Secondary | ICD-10-CM

## 2023-11-19 DIAGNOSIS — I82452 Acute embolism and thrombosis of left peroneal vein: Secondary | ICD-10-CM

## 2023-11-19 LAB — POCT GLYCOSYLATED HEMOGLOBIN (HGB A1C): Hemoglobin A1C: 6.7 % — AB (ref 4.0–5.6)

## 2023-11-19 LAB — BASIC METABOLIC PANEL
Anion gap: 7 (ref 5–15)
BUN: 10 mg/dL (ref 8–23)
CO2: 26 mmol/L (ref 22–32)
Calcium: 9.2 mg/dL (ref 8.9–10.3)
Chloride: 107 mmol/L (ref 98–111)
Creatinine, Ser: 0.81 mg/dL (ref 0.44–1.00)
GFR, Estimated: >60 mL/min (ref >60)
Glucose, Bld: 120 mg/dL — ABNORMAL HIGH (ref 70–99)
Potassium: 4.2 mmol/L (ref 3.5–5.1)
Sodium: 140 mmol/L (ref 135–145)

## 2023-11-19 LAB — LIPID PANEL
Cholesterol: 209 mg/dL — ABNORMAL HIGH (ref 0–200)
HDL: 52 mg/dL (ref >40)
LDL Cholesterol: 129 mg/dL — ABNORMAL HIGH (ref 0–99)
Total CHOL/HDL Ratio: 4 {ratio}
Triglycerides: 139 mg/dL (ref <150)
VLDL: 28 mg/dL (ref 0–40)

## 2023-11-19 LAB — TSH: TSH: 3.561 u[IU]/mL (ref 0.350–4.500)

## 2023-11-19 LAB — GLUCOSE, CAPILLARY: Glucose-Capillary: 138 mg/dL — ABNORMAL HIGH (ref 70–99)

## 2023-11-19 LAB — CK: Total CK: 136 U/L (ref 38–234)

## 2023-11-19 MED ORDER — FAMOTIDINE 20 MG PO TABS
20.0000 mg | ORAL_TABLET | Freq: Every day | ORAL | 2 refills | Status: DC
  Filled 2023-11-19: qty 30, 30d supply, fill #0
  Filled 2023-12-19: qty 30, 30d supply, fill #1
  Filled 2024-01-24: qty 30, 30d supply, fill #2

## 2023-11-19 MED ORDER — FAMOTIDINE 20 MG PO TABS: 20.0000 mg | ORAL_TABLET | Freq: Every day | ORAL | 2 refills | Status: DC

## 2023-11-19 MED ORDER — AMLODIPINE BESYLATE 10 MG PO TABS: 10.0000 mg | ORAL_TABLET | Freq: Every day | ORAL | 1 refills | Status: DC

## 2023-11-19 MED ORDER — LOSARTAN POTASSIUM 25 MG PO TABS
25.0000 mg | ORAL_TABLET | Freq: Every day | ORAL | 2 refills | Status: DC
  Filled 2023-11-19: qty 30, 30d supply, fill #0
  Filled 2023-12-19: qty 30, 30d supply, fill #1
  Filled 2024-01-24: qty 30, 30d supply, fill #2

## 2023-11-19 MED ORDER — LOSARTAN POTASSIUM 25 MG PO TABS: 25.0000 mg | ORAL_TABLET | Freq: Every day | ORAL | 2 refills | Status: DC

## 2023-11-19 MED ORDER — ATORVASTATIN CALCIUM 40 MG PO TABS
40.0000 mg | ORAL_TABLET | Freq: Every day | ORAL | 3 refills | Status: DC
  Filled 2023-11-19: qty 30, 30d supply, fill #0

## 2023-11-19 MED ORDER — RIVAROXABAN 20 MG PO TABS: 20.0000 mg | ORAL_TABLET | Freq: Every day | ORAL | 3 refills | Status: DC

## 2023-11-19 MED ORDER — LEVOTHYROXINE SODIUM 50 MCG PO TABS
50.0000 ug | ORAL_TABLET | Freq: Every day | ORAL | 2 refills | Status: DC
  Filled 2023-11-19: qty 30, 30d supply, fill #0
  Filled 2023-12-19: qty 30, 30d supply, fill #1
  Filled 2024-01-24: qty 30, 30d supply, fill #2

## 2023-11-19 MED ORDER — LEVOTHYROXINE SODIUM 50 MCG PO TABS: 50.0000 ug | ORAL_TABLET | Freq: Every day | ORAL | 2 refills | Status: DC

## 2023-11-19 MED ORDER — AMLODIPINE BESYLATE 10 MG PO TABS
10.0000 mg | ORAL_TABLET | Freq: Every day | ORAL | 1 refills | Status: DC
  Filled 2023-11-19 – 2023-12-19 (×2): qty 90, 90d supply, fill #0
  Filled 2024-01-24 – 2024-01-26 (×2): qty 90, 90d supply, fill #1

## 2023-11-19 MED ORDER — PRAVASTATIN SODIUM 40 MG PO TABS
40.0000 mg | ORAL_TABLET | Freq: Every evening | ORAL | 11 refills | Status: DC
  Filled 2023-11-19: qty 30, 30d supply, fill #0
  Filled 2023-12-19: qty 30, 30d supply, fill #1
  Filled 2024-01-24: qty 30, 30d supply, fill #2

## 2023-11-19 MED ORDER — ATORVASTATIN CALCIUM 40 MG PO TABS: 40.0000 mg | ORAL_TABLET | Freq: Every day | ORAL | 3 refills | Status: DC

## 2023-11-19 MED ORDER — RIVAROXABAN 20 MG PO TABS
20.0000 mg | ORAL_TABLET | Freq: Every day | ORAL | 3 refills | Status: DC
  Filled 2023-11-19 – 2024-01-24 (×3): qty 90, 90d supply, fill #0

## 2023-11-19 NOTE — Patient Instructions (Signed)
Thank you, [name] for allowing [place] to provide your care today. Today we discussed:  Blood pressure Keep taking amlodipine and losartan once a day Diabetes Your A1c is 6.7% - which is a little bit higher than last time. I would like you to restart ozempic, but we will do this after you get medicare part D   Leg/muscle aches We are doing blood work today to see if we can figure out why you are having these aches and pains I would NOT take BC powder for the pain - you can take tylenol instead I will call you after the 1st of the year with the results   I have ordered the following labs for you:   Lab Orders         Glucose, capillary         Basic metabolic panel         TSH         CK, total         Lipid Profile         POC Hbg A1C       Referrals ordered today:   Referral Orders  No referral(s) requested today     I have ordered the following medication/changed the following medications:   Stop the following medications: Medications Discontinued During This Encounter  Medication Reason   rivaroxaban (XARELTO) 20 MG TABS tablet Reorder   atorvastatin (LIPITOR) 40 MG tablet Reorder   levothyroxine (SYNTHROID) 50 MCG tablet Reorder   losartan (COZAAR) 25 MG tablet Reorder   amLODipine (NORVASC) 10 MG tablet Reorder   famotidine (PEPCID) 20 MG tablet Reorder     Start the following medications: Meds ordered this encounter  Medications   amLODipine (NORVASC) 10 MG tablet    Sig: Take 1 tablet (10 mg total) by mouth daily.    Dispense:  90 tablet    Refill:  1   atorvastatin (LIPITOR) 40 MG tablet    Sig: Take 1 tablet (40 mg total) by mouth daily at 6 PM.    Dispense:  90 tablet    Refill:  3   rivaroxaban (XARELTO) 20 MG TABS tablet    Sig: Take 1 tablet (20 mg total) by mouth daily with supper.    Dispense:  90 tablet    Refill:  3    IM program   losartan (COZAAR) 25 MG tablet    Sig: Take 1 tablet (25 mg total) by mouth daily.    Dispense:  90  tablet    Refill:  2   levothyroxine (SYNTHROID) 50 MCG tablet    Sig: Take 1 tablet (50 mcg total) by mouth daily.    Dispense:  90 tablet    Refill:  2   famotidine (PEPCID) 20 MG tablet    Sig: Take 1 tablet (20 mg total) by mouth at bedtime.    Dispense:  90 tablet    Refill:  2     Follow up: 3 months   Should you have any questions or concerns please call the internal medicine clinic at [phone].     Elza Rafter, D.O. [facility]

## 2023-11-19 NOTE — Progress Notes (Signed)
CC: diabetes  HPI:  Isabel Chen is a 67 y.o. female living with a history stated below and presents today for a routine follow up of her diabetes. Please see problem based assessment and plan for additional details.  Past Medical History:  Diagnosis Date   Arthritis    GERD (gastroesophageal reflux disease)    Heart murmur yrs ago   History of bronchitis last 2016   Pneumonia 2010   Thyroid disease     Current Outpatient Medications on File Prior to Visit  Medication Sig Dispense Refill   Blood Glucose Monitoring Suppl (FREESTYLE LITE) w/Device KIT Use as directed 1 kit 0   camphor-menthol (SARNA) lotion Apply 1 application topically as needed for itching. 222 mL 1   No current facility-administered medications on file prior to visit.    Family History  Problem Relation Age of Onset   Diabetes Mother    Hypertension Mother    Stroke Father    Cancer Maternal Grandmother        pancreatic    Social History   Socioeconomic History   Marital status: Widowed    Spouse name: Not on file   Number of children: Not on file   Years of education: Not on file   Highest education level: Not on file  Occupational History   Not on file  Tobacco Use   Smoking status: Former    Current packs/day: 0.00    Average packs/day: 0.3 packs/day for 10.0 years (2.5 ttl pk-yrs)    Types: Cigarettes    Start date: 11/23/1973    Quit date: 11/24/1983    Years since quitting: 40.0   Smokeless tobacco: Never  Vaping Use   Vaping status: Never Used  Substance and Sexual Activity   Alcohol use: Yes    Comment: glass of wine on new year's eve   Drug use: No   Sexual activity: Not on file  Other Topics Concern   Not on file  Social History Narrative   Not on file   Social Drivers of Health   Financial Resource Strain: Not on file  Food Insecurity: Not on file  Transportation Needs: Not on file  Physical Activity: Not on file  Stress: Not on file  Social Connections: Not on  file  Intimate Partner Violence: Not on file    Review of Systems: ROS negative except for what is noted on the assessment and plan.  Vitals:   11/19/23 1021  BP: 133/60  Temp: 98.1 F (36.7 C)  TempSrc: Oral  SpO2: 97%  Weight: (!) 332 lb (150.6 kg)  Height: 5\' 10"  (1.778 m)    Physical Exam: Constitutional: well-appearing, obese female sitting in wheelchair, in no acute distress Cardiovascular: regular rate and rhythm Pulmonary/Chest: normal work of breathing on room air MSK: normal bulk and tone Neurological: alert & oriented x 3, 5/5 strength bilateral lower extremities with sensation intact Psych: normal mood and behavior  Assessment & Plan:    Patient discussed with Dr. Lafonda Mosses  Hypertension Blood pressure stable with SBP in 130s. Patient is on amlodipine 10 mg daily and losartan 25 mg daily.  BMP today.  DM (diabetes mellitus), type 2 (HCC) A1c is slightly increased from 6.5% to 6.7% today.  She is not on any medications for diabetes at this time, but we discussed restarting Ozempic after the first of the year when the patient gets Medicare part D.  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  Myalgia Patient states that for the past month she has had muscle aches/soreness in her bilateral lower extremities.  She states that the muscle aches have caused her the inability to stand for long periods of time, due to the pain.  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  She is taking BC powder, which has helped to alleviate her pain, but a previous provider had told her that she should not take BC powder.    The patient denies any numbness or tingling, and also denies any proximal muscle pain.  On exam, her muscle strength is 5 out of 5 in her  bilateral lower extremities and sensation is equal and intact.  She has no shoulder pain, nor does she have upper extremity weakness.  Plan: -TSH (rule out hypothyroidism as etiology, although unlikely without other symptoms) -CK (to evaluate for statin induced myalgia) -BMP (to rule out hypokalemia, hypercalcemia) -Did not get ESR, as patient does not have shoulder pain or proximal muscle weakness   Makaelyn Aponte, D.O. [facility] Internal Medicine, PGY-3 Phone: [phone] Date 11/19/2023 Time 11:56 AM

## 2023-11-19 NOTE — Progress Notes (Signed)
BMP, TSH, and CK  unremarkable - no etiology of this patient's mylagias have been identified. Will switch atorvastatin to pravastatin to see if she gets any relief.

## 2023-11-19 NOTE — Assessment & Plan Note (Signed)
A1c is slightly increased from 6.5% to 6.7% today.  She is not on any medications for diabetes at this time, but we discussed restarting Ozempic after the first of the year when the patient gets Medicare part D.

## 2023-11-19 NOTE — Assessment & Plan Note (Addendum)
Patient states that for the past month she has had muscle aches/soreness in her bilateral lower extremities.  She states that the muscle aches have caused her the inability to stand for long periods of time, due to the pain.  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  She is taking BC powder, which has helped to alleviate her pain, but a previous provider had told her that she should not take BC powder.    The patient denies any numbness or tingling, and also denies any proximal muscle pain.  On exam, her muscle strength is 5 out of 5 in her bilateral lower extremities and sensation is equal and intact.  She has no shoulder pain, nor does she have upper extremity weakness.  Plan: -TSH (rule out hypothyroidism as etiology, although unlikely without other symptoms) -CK (to evaluate for statin induced myalgia) -BMP (to rule out hypokalemia, hypercalcemia) -Did not get ESR, as patient does not have shoulder pain or proximal muscle weakness

## 2023-11-19 NOTE — Progress Notes (Signed)
Discontinued atorvastatin and prescribed pravastatin, as atorvastatin may be the cause of this patient's myalgias.

## 2023-11-19 NOTE — Assessment & Plan Note (Signed)
Blood pressure stable with SBP in 130s. Patient is on amlodipine 10 mg daily and losartan 25 mg daily.  BMP today.

## 2023-11-19 NOTE — Progress Notes (Signed)
Internal Medicine Clinic Attending  Case discussed with the resident at the time of the visit.  We reviewed the resident's history and exam and pertinent patient test results.  I agree with the assessment, diagnosis, and plan of care documented in the resident's note.  

## 2023-11-19 NOTE — Assessment & Plan Note (Signed)
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

## 2023-12-20 ENCOUNTER — Other Ambulatory Visit: Payer: Self-pay

## 2023-12-20 ENCOUNTER — Encounter (HOSPITAL_COMMUNITY): Payer: Self-pay

## 2023-12-20 ENCOUNTER — Other Ambulatory Visit (HOSPITAL_COMMUNITY): Payer: Self-pay

## 2024-01-24 ENCOUNTER — Other Ambulatory Visit (HOSPITAL_COMMUNITY): Payer: Self-pay

## 2024-01-24 ENCOUNTER — Other Ambulatory Visit: Payer: Self-pay

## 2024-01-26 ENCOUNTER — Other Ambulatory Visit (HOSPITAL_COMMUNITY): Payer: Self-pay

## 2024-02-15 NOTE — Progress Notes (Addendum)
 Pharmacy Medication Assistance Program Note    02/23/2024  Patient ID: Isabel Chen, female   DOB: 06-18-1956, 68 y.o.   MRN: [id number]     09/23/2023  Outreach Medication One  Manufacturer Medication One [facility] Xarelto  Dose of Xarelto 20MG   Type of Assistance Manufacturer Assistance  Date Application Sent to Patient 09/23/2023  Application Items Requested Application  Date Application Sent to Prescriber 02/16/2024  Name of Prescriber KATIE MASTERS  Date Application Received From Patient 02/15/2024  Application Items Received From Patient Application  Date Application Received From Provider 02/23/2024  Date Application Submitted to Manufacturer 02/23/2024  Method Application Sent to Manufacturer Fax     Submitted 02/23/24

## 2024-02-24 ENCOUNTER — Telehealth: Payer: Self-pay | Admitting: Student

## 2024-02-24 NOTE — Telephone Encounter (Signed)
 Called the patient and routed call to the Pharmacy [facility] to f/u with the following request for assistance.  Please see message below as the patient's ins will not start until 03/23/2024 and she is requesting until then getting the following medication and f/u with paperwork she states she sent in to [facility] in December 2024 for the medicaiton.   Copied from CRM [id number]. Topic: Clinical - Prescription Issue >> Feb 23, 2024  2:11 PM Everette Rank wrote: Reason for CRM: Xarelto Medication-Isabel Chen From [facility]  Due to patient trying for assistance for medication is Inconclusive. They need a  copy of the 1040 for 2023-2024. Need this signed by PCP letter if patient does not file taxes . Needs call back or fax over [phone]  fax [phone]  CB:[phone]  Mon-Fri 8am-8pm EST

## 2024-02-25 NOTE — Telephone Encounter (Signed)
 Left message informing patient that proof of income is needed for assistance. Left call back number [phone] for patient to reach back out.

## 2024-02-25 NOTE — Telephone Encounter (Signed)
 Updated on 07/29/23 telephone note for xarelto assistance.

## 2024-02-25 NOTE — Telephone Encounter (Signed)
 02/24/24:  Reason for CRM: Xarelto Medication-Isabel Chen From [employer]   Due to patient trying for assistance for medication is Inconclusive. They need a  copy of the 1040 for 2023-2024. Need this signed by PCP letter if patient does not file taxes . Needs call back or fax over [phone]   fax [phone]  CB:[phone]  Mon-Fri 8am-8pm EST

## 2024-02-28 NOTE — Telephone Encounter (Signed)
 Please see the previous message sent in regards to paperwork needed.   Copied from CRM [id number]. Topic: General - Other >> Feb 25, 2024 12:38 PM Carrielelia G wrote: [place] with [employer] patient assistance program is calling in regards to Pt. Lucarelli application for assistance for the Medication Xarelto. He application was denied. So [place] said if you can send a letter of  hardship. Please advise   Fax# [phone]

## 2024-03-14 ENCOUNTER — Other Ambulatory Visit: Payer: Self-pay

## 2024-03-14 DIAGNOSIS — I10 Essential (primary) hypertension: Secondary | ICD-10-CM

## 2024-03-14 DIAGNOSIS — I82452 Acute embolism and thrombosis of left peroneal vein: Secondary | ICD-10-CM

## 2024-03-14 DIAGNOSIS — E039 Hypothyroidism, unspecified: Secondary | ICD-10-CM

## 2024-03-14 DIAGNOSIS — K219 Gastro-esophageal reflux disease without esophagitis: Secondary | ICD-10-CM

## 2024-03-14 MED ORDER — PRAVASTATIN SODIUM 40 MG PO TABS: 40.0000 mg | ORAL_TABLET | Freq: Every evening | ORAL | 11 refills | Status: DC

## 2024-03-14 MED ORDER — FAMOTIDINE 20 MG PO TABS: 20.0000 mg | ORAL_TABLET | Freq: Every day | ORAL | 2 refills | Status: DC

## 2024-03-14 MED ORDER — AMLODIPINE BESYLATE 10 MG PO TABS: 10.0000 mg | ORAL_TABLET | Freq: Every day | ORAL | 1 refills | Status: DC

## 2024-03-14 MED ORDER — LOSARTAN POTASSIUM 25 MG PO TABS: 25.0000 mg | ORAL_TABLET | Freq: Every day | ORAL | 2 refills | Status: DC

## 2024-03-14 MED ORDER — LEVOTHYROXINE SODIUM 50 MCG PO TABS: 50.0000 ug | ORAL_TABLET | Freq: Every day | ORAL | 2 refills | Status: DC

## 2024-03-14 MED ORDER — RIVAROXABAN 20 MG PO TABS: 20.0000 mg | ORAL_TABLET | Freq: Every day | ORAL | 3 refills | Status: AC

## 2024-03-14 NOTE — Telephone Encounter (Signed)
 Patient last seen 11/19/23, I called the patient to schedule a follow up appointment in order for us  to continue to refill her medications.   [facility] pharmacy is requesting medication refills.

## 2024-04-12 ENCOUNTER — Ambulatory Visit (INDEPENDENT_AMBULATORY_CARE_PROVIDER_SITE_OTHER): Payer: Self-pay | Admitting: Student

## 2024-04-12 ENCOUNTER — Encounter: Payer: Self-pay | Admitting: Student

## 2024-04-12 VITALS — BP 131/61 | HR 76 | Wt 310.2 lb

## 2024-04-12 DIAGNOSIS — I10 Essential (primary) hypertension: Secondary | ICD-10-CM

## 2024-04-12 DIAGNOSIS — E785 Hyperlipidemia, unspecified: Secondary | ICD-10-CM

## 2024-04-12 DIAGNOSIS — E039 Hypothyroidism, unspecified: Secondary | ICD-10-CM | POA: Diagnosis not present

## 2024-04-12 DIAGNOSIS — E119 Type 2 diabetes mellitus without complications: Secondary | ICD-10-CM

## 2024-04-12 DIAGNOSIS — Z1231 Encounter for screening mammogram for malignant neoplasm of breast: Secondary | ICD-10-CM | POA: Insufficient documentation

## 2024-04-12 DIAGNOSIS — Z7901 Long term (current) use of anticoagulants: Secondary | ICD-10-CM | POA: Diagnosis not present

## 2024-04-12 DIAGNOSIS — E7849 Other hyperlipidemia: Secondary | ICD-10-CM

## 2024-04-12 DIAGNOSIS — E038 Other specified hypothyroidism: Secondary | ICD-10-CM

## 2024-04-12 NOTE — Progress Notes (Addendum)
 CC: 2-month follow-up appointment for chronic condition management  HPI:  [name] is a 68 y.o. female with a past medical history of hypertension, OSA, GERD, type 2 diabetes, hypothyroidism who presents for 57-month follow-up appointment.  Please see assessment and plan for full HPI.  Medications: Hypertension: Amlodipine  10 mg daily, losartan  25 mg daily History of DVT: Xarelto  20 mg daily Hyperlipidemia: Pravastatin  40 mg daily Hypothyroidism: Synthroid  50 mcg daily GERD: Pepcid  20 mg nightly  Past Medical History:  Diagnosis Date   Arthritis    GERD (gastroesophageal reflux disease)    Heart murmur yrs ago   History of bronchitis last 2016   Pneumonia 2010   Thyroid  disease      Current Outpatient Medications:    rosuvastatin (CRESTOR) 10 MG tablet, Take 1 tablet (10 mg total) by mouth daily., Disp: 30 tablet, Rfl: 11   amLODipine  (NORVASC ) 10 MG tablet, Take 1 tablet (10 mg total) by mouth daily., Disp: 90 tablet, Rfl: 1   Blood Glucose Monitoring Suppl (FREESTYLE LITE) w/Device KIT, Use as directed, Disp: 1 kit, Rfl: 0   camphor-menthol  (SARNA) lotion, Apply 1 application topically as needed for itching., Disp: 222 mL, Rfl: 1   famotidine  (PEPCID ) 20 MG tablet, Take 1 tablet (20 mg total) by mouth at bedtime., Disp: 90 tablet, Rfl: 2   levothyroxine  (SYNTHROID ) 50 MCG tablet, Take 1 tablet (50 mcg total) by mouth daily., Disp: 90 tablet, Rfl: 2   losartan  (COZAAR ) 25 MG tablet, Take 1 tablet (25 mg total) by mouth daily., Disp: 90 tablet, Rfl: 2   rivaroxaban  (XARELTO ) 20 MG TABS tablet, Take 1 tablet (20 mg total) by mouth daily with supper., Disp: 90 tablet, Rfl: 3  Review of Systems:    Negative except for what is stated in HPI  Physical Exam:  Vitals:   04/12/24 1101 04/12/24 1135  BP: (!) 120/96 131/61  Pulse: 98 76  SpO2: 100%   Weight: (!) 310 lb 3.2 oz (140.7 kg)    General: Patient is sitting comfortably in the room  Head: Normocephalic,  atraumatic  Cardio: Regular rate and rhythm, no murmurs, rubs or gallops Pulmonary: Clear to ausculation bilaterally with no rales, rhonchi, and crackles  Extremities: Bilateral lower extremities with sensation intact, 2+ pedal pulses, no erythema or solutions appreciated.  Multiple calluses appreciated  Assessment & Plan:   Hypertension Patient has a past medical history of hypertension.  Blood pressure 131/61 today.  Her current medication regimen includes amlodipine  10 mg daily and losartan  25 mg daily.  Well-controlled at this time.  No acute concerns.  Plan: - Continue amlodipine  10 mg daily - Continue losartan  25 mg daily - Obtain BMP today to evaluate kidney function and electrolytes  DM (diabetes mellitus), type 2 (HCC) Patient has a past medical history of type 2 diabetes mellitus.  It is well-controlled as last A1c in December 2024 was 6.7.  She is not on any medications for diabetes at this time.  Will obtain urine microalbumin creatinine ratio today.  Plan: - Renal protective agent includes losartan  25 mg daily - Obtain urine ACR today - A1c pending - Obtain BMP today - Patient is followed by ophthalmology and she states she will make an appointment -Foot exam updated today no ulcerations or erythema or signs of infection.  Sensation intact.  2+ pedal pulses.  Hypothyroidism Patient has a past medical history of hypothyroidism.  Her current medication includes levothyroxine  50 mcg daily.  She has had no concerns  with this medication.  Her most recent TSH in December was 3.56.  Well-controlled.  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  No acute concerns for bleeding.  Patient reports she is doing well with his medication.  Plan: - Continue  Xarelto  20 mg daily  Hyperlipemia Patient has a past medical history of hyperlipidemia.  Her most recent lipid panel in December 2024 showed elevated cholesterol at 129.  She is currently on pravastatin  40 mg daily.  She denies any myalgias.  Plan: - continue pravastatin  40 mg daily - Obtain lipid panel  Addendum: LDL not at goal.  Will need to start Crestor 10 mg daily.  Stop pravastatin .  Patient discussed with Dr. Sonia Durand, DO PGY-2 Internal Medicine Resident

## 2024-04-12 NOTE — Assessment & Plan Note (Addendum)
 Patient has a past medical history of type 2 diabetes mellitus.  It is well-controlled as last A1c in December 2024 was 6.7.  She is not on any medications for diabetes at this time.  Will obtain urine microalbumin creatinine ratio today.  Plan: - Renal protective agent includes losartan  25 mg daily - Obtain urine ACR today - A1c pending - Obtain BMP today - Patient is followed by ophthalmology and she states she will make an appointment -Foot exam updated today no ulcerations or erythema or signs of infection.  Sensation intact.  2+ pedal pulses.

## 2024-04-12 NOTE — Assessment & Plan Note (Addendum)
 Patient has a past medical history of hyperlipidemia.  Her most recent lipid panel in December 2024 showed elevated cholesterol at 129.  She is currently on pravastatin  40 mg daily.  She denies any myalgias.  Plan: - continue pravastatin  40 mg daily - Obtain lipid panel  Addendum: LDL not at goal.  Will need to start Crestor 10 mg daily.  Stop pravastatin .

## 2024-04-12 NOTE — Assessment & Plan Note (Signed)
 Patient has a history of DVT and is on chronic Xarelto  for this.  No acute concerns for bleeding.  Patient reports she is doing well with his medication.  Plan: - Continue Xarelto  20 mg daily

## 2024-04-12 NOTE — Assessment & Plan Note (Signed)
Mammogram ordered for breast cancer screening.

## 2024-04-12 NOTE — Patient Instructions (Signed)
 Isabel Chen you for allowing me to take part in your care today.  Here are your instructions.  1. I am taking many labs from you today. I will either send you a mychart message or call you with the results.  2. Please continue taking all your medications as prescribed.  3. Please come back in 3 months.   4. I have provided you with some exercises for when you are not driving.   Thank you, Dr. Lydia Sams  If you have any other questions please contact the internal medicine clinic at [phone] If it is after hours, please call the [place] [facility] at [phone] and then ask the person who picks up for the resident on call.

## 2024-04-12 NOTE — Assessment & Plan Note (Signed)
 Patient has a past medical history of hypothyroidism.  Her current medication includes levothyroxine  50 mcg daily.  She has had no concerns with this medication.  Her most recent TSH in December was 3.56.  Well-controlled.  Plan: - Obtain TSH - Continue levothyroxine  50 mcg daily -Will titrate based off TSH if needed

## 2024-04-12 NOTE — Assessment & Plan Note (Signed)
 Patient has a past medical history of hypertension.  Blood pressure 131/61 today.  Her current medication regimen includes amlodipine  10 mg daily and losartan  25 mg daily.  Well-controlled at this time.  No acute concerns.  Plan: - Continue amlodipine  10 mg daily - Continue losartan  25 mg daily - Obtain BMP today to evaluate kidney function and electrolytes

## 2024-04-13 ENCOUNTER — Ambulatory Visit: Payer: Self-pay | Admitting: Student

## 2024-04-13 LAB — BMP8+ANION GAP
Anion Gap: 16 mmol/L (ref 10.0–18.0)
BUN/Creatinine Ratio: 11 — ABNORMAL LOW (ref 12–28)
BUN: 11 mg/dL (ref 8–27)
CO2: 23 mmol/L (ref 20–29)
Calcium: 9.4 mg/dL (ref 8.7–10.3)
Chloride: 103 mmol/L (ref 96–106)
Creatinine, Ser: 0.97 mg/dL (ref 0.57–1.00)
Glucose: 105 mg/dL — ABNORMAL HIGH (ref 70–99)
Potassium: 4 mmol/L (ref 3.5–5.2)
Sodium: 142 mmol/L (ref 134–144)
eGFR: 64 mL/min/{1.73_m2} (ref >59)

## 2024-04-13 LAB — LIPID PANEL
Chol/HDL Ratio: 3.8 ratio (ref 0.0–4.4)
Cholesterol, Total: 197 mg/dL (ref 100–199)
HDL: 52 mg/dL (ref >39)
LDL Chol Calc (NIH): 122 mg/dL — ABNORMAL HIGH (ref 0–99)
Triglycerides: 130 mg/dL (ref 0–149)
VLDL Cholesterol Cal: 23 mg/dL (ref 5–40)

## 2024-04-13 LAB — HEMOGLOBIN A1C
Est. average glucose Bld gHb Est-mCnc: 137 mg/dL
Hgb A1c MFr Bld: 6.4 % — ABNORMAL HIGH (ref 4.8–5.6)

## 2024-04-13 LAB — MICROALBUMIN / CREATININE URINE RATIO
Creatinine, Urine: 75.9 mg/dL
Microalb/Creat Ratio: <4 mg/g{creat} (ref 0–29)
Microalbumin, Urine: <3 ug/mL

## 2024-04-13 LAB — TSH: TSH: 1.85 u[IU]/mL (ref 0.450–4.500)

## 2024-04-13 MED ORDER — ROSUVASTATIN CALCIUM 10 MG PO TABS: 10.0000 mg | ORAL_TABLET | Freq: Every day | ORAL | 11 refills | Status: DC

## 2024-04-13 NOTE — Progress Notes (Signed)
 Internal Medicine Clinic Attending  Case discussed with the resident at the time of the visit.  We reviewed the resident's history and exam and pertinent patient test results.  I agree with the assessment, diagnosis, and plan of care documented in the resident's note.

## 2024-04-13 NOTE — Addendum Note (Signed)
 Addended by: Deziray Nabi on: 04/13/2024 09:29 AM   Modules accepted: Orders

## 2024-05-09 ENCOUNTER — Telehealth: Payer: Self-pay

## 2024-05-09 MED ORDER — ACCU-CHEK SOFTCLIX LANCETS MISC: 12 refills | Status: AC

## 2024-05-09 MED ORDER — BD SWAB SINGLE USE REGULAR PADS: 1.0000 | MEDICATED_PAD | Freq: Every day | 3 refills | Status: AC

## 2024-05-09 NOTE — Telephone Encounter (Signed)
 Pharmacy is requesting accu -chek softclix lancets and BD single use swab.  [facility] - [place], [place] - [address]

## 2024-05-10 ENCOUNTER — Telehealth: Payer: Self-pay

## 2024-05-10 MED ORDER — ACCU-CHEK GUIDE ME W/DEVICE KIT: 1.0000 | PACK | Freq: Every day | 2 refills | Status: DC

## 2024-05-10 NOTE — Telephone Encounter (Signed)
 Pharmacy is requesting a rx for Accu-Chek guide me glucose meter.  [facility] - [place], [place] - [address]

## 2024-05-17 ENCOUNTER — Ambulatory Visit

## 2024-06-28 ENCOUNTER — Ambulatory Visit

## 2024-07-26 ENCOUNTER — Other Ambulatory Visit: Payer: Self-pay | Admitting: *Deleted

## 2024-07-26 DIAGNOSIS — E119 Type 2 diabetes mellitus without complications: Secondary | ICD-10-CM

## 2024-07-26 MED ORDER — ACCU-CHEK GUIDE TEST VI STRP: ORAL_STRIP | 1 refills | Status: DC

## 2024-07-27 ENCOUNTER — Other Ambulatory Visit: Payer: Self-pay | Admitting: Student

## 2024-07-28 NOTE — Telephone Encounter (Signed)
 Medication sent to pharmacy

## 2024-09-21 ENCOUNTER — Other Ambulatory Visit: Payer: Self-pay | Admitting: Student

## 2024-09-21 DIAGNOSIS — I10 Essential (primary) hypertension: Secondary | ICD-10-CM

## 2024-09-21 NOTE — Telephone Encounter (Signed)
 Medication sent to pharmacy

## 2024-10-02 ENCOUNTER — Other Ambulatory Visit: Payer: Self-pay | Admitting: Student

## 2024-10-02 MED ORDER — ROSUVASTATIN CALCIUM 10 MG PO TABS: 10.0000 mg | ORAL_TABLET | Freq: Every day | ORAL | 0 refills | Status: AC

## 2024-10-02 NOTE — Telephone Encounter (Signed)
 Copied from CRM [id number]. Topic: Clinical - Medication Refill >> Oct 02, 2024  3:44 PM DeAngela L wrote: Medication: rosuvastatin  (CRESTOR ) 10 MG tablet   Has the patient contacted their pharmacy? Yes the pharmacy is the one calling  (Agent: If no, request that the patient contact the pharmacy for the refill. If patient does not wish to contact the pharmacy document the reason why and proceed with request.) (Agent: If yes, when and what did the pharmacy advise?)  This is the patient's preferred pharmacy:  [facility] - [place], [place] - [address] [facility] [place] [address] Phone: [phone] Fax: [phone] Phone [phone] Fax [phone] this is th updated from [facility] when they called   Is this the correct pharmacy for this prescription? Yes  If no, delete pharmacy and type the correct one.   Has the prescription been filled recently? Yes   Is the patient out of the medication? Yes   Has the patient been seen for an appointment in the last year OR does the patient have an upcoming appointment? Yes  Can we respond through MyChart? Unknown   Agent: Please be advised that Rx refills may take up to 3 business days. We ask that you follow-up with your pharmacy.

## 2024-11-08 ENCOUNTER — Telehealth: Payer: Self-pay | Admitting: *Deleted

## 2024-11-08 NOTE — Telephone Encounter (Signed)
 RTC to patient.  Message left that she may need to call for an appointment prior to getting a referral to the [facility].

## 2024-11-09 ENCOUNTER — Encounter: Payer: Self-pay | Admitting: Student

## 2024-12-04 ENCOUNTER — Other Ambulatory Visit: Payer: Self-pay | Admitting: Student

## 2024-12-04 DIAGNOSIS — E039 Hypothyroidism, unspecified: Secondary | ICD-10-CM

## 2024-12-04 DIAGNOSIS — K219 Gastro-esophageal reflux disease without esophagitis: Secondary | ICD-10-CM

## 2024-12-04 DIAGNOSIS — I10 Essential (primary) hypertension: Secondary | ICD-10-CM

## 2024-12-04 NOTE — Telephone Encounter (Signed)
 Patient last seen 04/12/24. I called the patient to schedule a appointment. I was unable to reach the patient. I lvm for her to give us  a call back.   Medication sent to pharmacy.

## 2024-12-15 ENCOUNTER — Other Ambulatory Visit: Payer: Self-pay | Admitting: Student

## 2024-12-15 DIAGNOSIS — E119 Type 2 diabetes mellitus without complications: Secondary | ICD-10-CM

## 2024-12-15 NOTE — Telephone Encounter (Signed)
 Medication sent to pharmacy.  Patient last seen 04/12/24 I called the patient to schedule a appointment. I was unable to reach the patient. I lvm for her to give us  a call back.

## 2024-12-20 ENCOUNTER — Ambulatory Visit: Payer: Self-pay

## 2024-12-20 VITALS — Ht 70.0 in | Wt 330.0 lb

## 2024-12-20 DIAGNOSIS — Z0000 Encounter for general adult medical examination without abnormal findings: Secondary | ICD-10-CM

## 2024-12-20 NOTE — Progress Notes (Addendum)
 "  Chief Complaint  Patient presents with   Medicare Wellness    SUBSEQUENT     Subjective:   Isabel Chen is a 69 y.o. female who presents for a Medicare Annual Wellness Visit.  Visit info / Clinical Intake: Medicare Wellness Visit Type:: Subsequent Annual Wellness Visit Persons participating in visit and providing information:: patient Medicare Wellness Visit Mode:: Video Since this visit was completed virtually, some vitals may be partially provided or unavailable. Missing vitals are due to the limitations of the virtual format.: Documented vitals are patient reported If Telephone or Video please confirm:: I connected with patient using audio/video enable telemedicine. I verified patient identity with two identifiers, discussed telehealth limitations, and patient agreed to proceed. Patient Location:: [facility] Provider Location:: HOME OFFICE Interpreter Needed?: No Pre-visit prep was completed: yes AWV questionnaire completed by patient prior to visit?: no Living arrangements:: (!) lives alone Patient's Overall Health Status Rating: (!) fair Typical amount of pain: some Does pain affect daily life?: no Are you currently prescribed opioids?: no  Dietary Habits and Nutritional Risks How many meals a day?: 2 Eats fruit and vegetables daily?: yes Most meals are obtained by: preparing own meals In the last 2 weeks, have you had any of the following?: none Diabetic:: no (PREDIABETES)  Functional Status Activities of Daily Living (to include ambulation/medication): Independent Ambulation: Independent Medication Administration: Independent Home Management (perform basic housework or laundry): Independent Manage your own finances?: yes Primary transportation is: driving Concerns about vision?: no *vision screening is required for [facility]* Concerns about hearing?: no  Fall Screening Falls in the past year?: 0 Number of falls in past year: 0 Was there an injury with Fall?: 0 Fall  Risk Category Calculator: 0 Patient Fall Risk Level: Low Fall Risk  Fall Risk Patient at Risk for Falls Due to: No Fall Risks Fall risk Follow up: Falls evaluation completed; Education provided  Home and Transportation Safety: All rugs have non-skid backing?: N/A, no rugs All stairs or steps have railings?: N/A, no stairs Grab bars in the bathtub or shower?: (!) no (PENDING) Have non-skid surface in bathtub or shower?: yes Good home lighting?: yes Regular seat belt use?: yes Hospital stays in the last year:: no  Cognitive Assessment Difficulty concentrating, remembering, or making decisions? : no Will 6CIT or Mini Cog be Completed: yes What year is it?: 0 points What month is it?: 0 points Give patient an address phrase to remember (5 components): Prentice Gasman [address] About what time is it?: 0 points Count backwards from 20 to 1: 0 points Say the months of the year in reverse: 0 points Repeat the address phrase from earlier: 0 points 6 CIT Score: 0 points  Advance Directives (For Healthcare) Does Patient Have a Medical Advance Directive?: No Would patient like information on creating a medical advance directive?: No - Patient declined  Reviewed/Updated  Reviewed/Updated: Reviewed All (Medical, Surgical, Family, Medications, Allergies, Care Teams, Patient Goals)    Allergies (verified) Tetracyclines & related   Current Medications (verified) Outpatient Encounter Medications as of 12/20/2024  Medication Sig   Accu-Chek Softclix Lancets lancets Use as instructed   Alcohol Swabs (B-D SINGLE USE SWABS REGULAR) PADS 1 Pad by Does not apply route daily.   amLODipine  (NORVASC ) 10 MG tablet TAKE 1 TABLET EVERY DAY   Blood Glucose Monitoring Suppl (ACCU-CHEK GUIDE ME) w/Device KIT USE AS DIRECTED   Blood Glucose Monitoring Suppl (FREESTYLE LITE) w/Device KIT Use as directed   camphor-menthol  (SARNA) lotion Apply  1 application topically as needed for itching.    famotidine  (PEPCID ) 20 MG tablet TAKE 1 TABLET AT BEDTIME   glucose blood (ACCU-CHEK GUIDE TEST) test strip TEST BLOOD SUGAR ONE TIME DAILY OR AS DIRECTED BY PROVIDER   levothyroxine  (SYNTHROID ) 50 MCG tablet TAKE 1 TABLET EVERY DAY   losartan  (COZAAR ) 25 MG tablet TAKE 1 TABLET EVERY DAY   rivaroxaban  (XARELTO ) 20 MG TABS tablet Take 1 tablet (20 mg total) by mouth daily with supper.   rosuvastatin  (CRESTOR ) 10 MG tablet Take 1 tablet (10 mg total) by mouth daily.   No facility-administered encounter medications on file as of 12/20/2024.    History: Past Medical History:  Diagnosis Date   Arthritis    GERD (gastroesophageal reflux disease)    Heart murmur yrs ago   History of bronchitis last 2016   Pneumonia 2010   Thyroid  disease    Past Surgical History:  Procedure Laterality Date   ABDOMINAL HYSTERECTOMY     partial   APPENDECTOMY  yrs ago   with exploratory surgery   BUNIONECTOMY Right    exploratory surgery  yrs ago   HIP SURGERY Right    for bursa   ROTATOR CUFF REPAIR Right    THYROID  LOBECTOMY Left 06/25/2016   Procedure: LEFT THYROID  LOBECTOMY;  Surgeon: Krystal Spinner, MD;  Location: [facility];  Service: General;  Laterality: Left;   Family History  Problem Relation Age of Onset   Diabetes Mother    Hypertension Mother    Stroke Father    Cancer Maternal Grandmother        pancreatic   Social History   Occupational History   Not on file  Tobacco Use   Smoking status: Former    Current packs/day: 0.00    Average packs/day: 0.3 packs/day for 10.0 years (2.5 ttl pk-yrs)    Types: Cigarettes    Start date: 11/23/1973    Quit date: 11/24/1983    Years since quitting: 41.1   Smokeless tobacco: Never  Vaping Use   Vaping status: Never Used  Substance and Sexual Activity   Alcohol use: Yes    Comment: glass of wine on new year's eve   Drug use: No   Sexual activity: Not on file   Tobacco Counseling Counseling given: Not Answered  SDOH Screenings   Food  Insecurity: No Food Insecurity (12/20/2024)  Housing: Low Risk (12/20/2024)  Transportation Needs: No Transportation Needs (12/20/2024)  Utilities: Not At Risk (12/20/2024)  Depression (PHQ2-9): Low Risk (12/20/2024)  Physical Activity: Inactive (12/20/2024)  Social Connections: Moderately Integrated (12/20/2024)  Stress: No Stress Concern Present (12/20/2024)  Tobacco Use: Medium Risk (12/20/2024)  Health Literacy: Adequate Health Literacy (12/20/2024)   See flowsheets for full screening details  Depression Screen PHQ 2 & 9 Depression Scale- Over the past 2 weeks, how often have you been bothered by any of the following problems? Little interest or pleasure in doing things: 0 Feeling down, depressed, or hopeless (PHQ Adolescent also includes...irritable): 0 PHQ-2 Total Score: 0 Trouble falling or staying asleep, or sleeping too much: 0 Feeling tired or having little energy: 0 Poor appetite or overeating (PHQ Adolescent also includes...weight loss): 0 Feeling bad about yourself - or that you are a failure or have let yourself or your family down: 0 Trouble concentrating on things, such as reading the newspaper or watching television (PHQ Adolescent also includes...like school work): 0 Moving or speaking so slowly that other people could have noticed. Or the opposite - being  so fidgety or restless that you have been moving around a lot more than usual: 0 Thoughts that you would be better off dead, or of hurting yourself in some way: 0 PHQ-9 Total Score: 0 If you checked off any problems, how difficult have these problems made it for you to do your work, take care of things at home, or get along with other people?: Not difficult at all  Depression Treatment Depression Interventions/Treatment : EYV7-0 Score <4 Follow-up Not Indicated     Goals Addressed             This Visit's Progress    12/20/2024: My goal for 2026 is to lose 100-150 pounds and start physical activity so that I can  control my HTN.               Objective:    Today's Vitals   12/20/24 1506  Weight: (!) 330 lb (149.7 kg)  Height: 5' 10 (1.778 m)  PainSc: 2   PainLoc: Leg   Body mass index is 47.35 kg/m.  Hearing/Vision screen Hearing Screening - Comments:: Adequate hearing. Vision Screening - Comments:: Patient wears otc readers. Eye exam completed by: [facility] Immunizations and Health Maintenance Health Maintenance  Topic Date Due   OPHTHALMOLOGY EXAM  04/02/2023   Mammogram  05/15/2023   Influenza Vaccine  06/23/2024   COVID-19 Vaccine (4 - 2025-26 season) 07/24/2024   HEMOGLOBIN A1C  10/13/2024   Diabetic kidney evaluation - eGFR measurement  04/12/2025   Diabetic kidney evaluation - Urine ACR  04/12/2025   FOOT EXAM  04/12/2025   Medicare Annual Wellness (AWV)  12/20/2025   DTaP/Tdap/Td (2 - Td or Tdap) 05/10/2031   Colonoscopy  09/27/2031   Pneumococcal Vaccine: 50+ Years  Completed   Bone Density Scan  Completed   Hepatitis C Screening  Completed   Zoster Vaccines- Shingrix  Completed   Meningococcal B Vaccine  Aged Out        Assessment/Plan:  This is a routine wellness examination for Kallan.  Patient Care Team: Tobie Gaines, DO as PCP - General  I have personally reviewed and noted the following in the patients chart:   Medical and social history Use of alcohol, tobacco or illicit drugs  Current medications and supplements including opioid prescriptions. Functional ability and status Nutritional status Physical activity Advanced directives List of other physicians Hospitalizations, surgeries, and ER visits in previous 12 months Vitals Screenings to include cognitive, depression, and falls Referrals and appointments  No orders of the defined types were placed in this encounter.  In addition, I have reviewed and discussed with patient certain preventive protocols, quality metrics, and best practice recommendations. A written personalized care plan  for preventive services as well as general preventive health recommendations were provided to patient.   Roz LOISE Fuller, LPN   [id number]/71/[id number]   Return in about 1 year (around 12/20/2025) for Medicare wellness.  After Visit Summary: (MyChart) Due to this being a telephonic visit, the after visit summary with patients personalized plan was offered to patient via MyChart   Nurse Notes:  No voiced or noted concerns at this time HM Addressed: Vaccines Due: Flu and Covid Labs Due HgA1C Screenings dure: Screening Mammogram and Diabetic Eye Exam  Internal Medicine Attending:  I reviewed the AWV findings of the medical professional who conducted the visit. I was present in the office suite and immediately available to provide assistance and direction throughout the time the service was provided.  "

## 2024-12-20 NOTE — Patient Instructions (Signed)
 Ms. Abdella,  Thank you for taking the time for your Medicare Wellness Visit. I appreciate your continued commitment to your health goals. Please review the care plan we discussed, and feel free to reach out if I can assist you further.  Please note that Annual Wellness Visits do not include a physical exam. Some assessments may be limited, especially if the visit was conducted virtually. If needed, we may recommend an in-person follow-up with your provider.  Ongoing Care Seeing your primary care provider every 3 to 6 months helps us  monitor your health and provide consistent, personalized care.   Referrals If a referral was made during today's visit and you haven't received any updates within two weeks, please contact the referred provider directly to check on the status.  Recommended Screenings:  Health Maintenance  Topic Date Due   Medicare Annual Wellness Visit  Never done   Eye exam for diabetics  04/02/2023   Breast Cancer Screening  05/15/2023   Flu Shot  06/23/2024   COVID-19 Vaccine (4 - 2025-26 season) 07/24/2024   Hemoglobin A1C  10/13/2024   Yearly kidney function blood test for diabetes  04/12/2025   Kidney health urinalysis for diabetes  04/12/2025   Complete foot exam   04/12/2025   DTaP/Tdap/Td vaccine (2 - Td or Tdap) 05/10/2031   Colon Cancer Screening  09/27/2031   Pneumococcal Vaccine for age over 88  Completed   Osteoporosis screening with Bone Density Scan  Completed   Hepatitis C Screening  Completed   Zoster (Shingles) Vaccine  Completed   Meningitis B Vaccine  Aged Out       12/20/2024    3:10 PM  Advanced Directives  Does Patient Have a Medical Advance Directive? No  Would patient like information on creating a medical advance directive? No - Patient declined    Vision: Annual vision screenings are recommended for early detection of glaucoma, cataracts, and diabetic retinopathy. These exams can also reveal signs of chronic conditions such as diabetes  and high blood pressure.  Dental: Annual dental screenings help detect early signs of oral cancer, gum disease, and other conditions linked to overall health, including heart disease and diabetes.  Please see the attached documents for additional preventive care recommendations.
